# Patient Record
Sex: Female | Born: 1990 | Race: Black or African American | Hispanic: No | Marital: Married | Smoking: Never smoker
Health system: Southern US, Community
[De-identification: ages and names within clinical notes are randomized; demographics above are authoritative.]

## PROBLEM LIST (undated history)

## (undated) DIAGNOSIS — Z8751 Personal history of pre-term labor: Secondary | ICD-10-CM

## (undated) DIAGNOSIS — J40 Bronchitis, not specified as acute or chronic: Secondary | ICD-10-CM

## (undated) DIAGNOSIS — K219 Gastro-esophageal reflux disease without esophagitis: Secondary | ICD-10-CM

## (undated) DIAGNOSIS — O24419 Gestational diabetes mellitus in pregnancy, unspecified control: Secondary | ICD-10-CM

## (undated) DIAGNOSIS — O3432 Maternal care for cervical incompetence, second trimester: Secondary | ICD-10-CM

## (undated) HISTORY — DX: Personal history of pre-term labor: Z87.51

## (undated) HISTORY — DX: Maternal care for cervical incompetence, second trimester: O34.32

## (undated) HISTORY — DX: Gestational diabetes mellitus in pregnancy, unspecified control: O24.419

---

## 2010-02-23 ENCOUNTER — Emergency Department: Payer: Self-pay | Admitting: Emergency Medicine

## 2010-02-25 ENCOUNTER — Emergency Department: Payer: Self-pay | Admitting: Emergency Medicine

## 2010-03-01 ENCOUNTER — Emergency Department: Payer: Self-pay | Admitting: Unknown Physician Specialty

## 2010-03-26 ENCOUNTER — Emergency Department: Payer: Self-pay | Admitting: Internal Medicine

## 2010-04-01 ENCOUNTER — Emergency Department: Payer: Self-pay | Admitting: Emergency Medicine

## 2010-04-11 ENCOUNTER — Emergency Department: Payer: Self-pay | Admitting: Unknown Physician Specialty

## 2010-05-21 ENCOUNTER — Inpatient Hospital Stay: Payer: Self-pay | Admitting: Obstetrics and Gynecology

## 2010-05-27 LAB — PATHOLOGY REPORT

## 2010-10-12 ENCOUNTER — Encounter: Payer: Self-pay | Admitting: Maternal & Fetal Medicine

## 2010-11-02 ENCOUNTER — Ambulatory Visit: Payer: Self-pay | Admitting: Obstetrics and Gynecology

## 2010-11-12 ENCOUNTER — Ambulatory Visit: Payer: Self-pay | Admitting: Obstetrics and Gynecology

## 2010-11-12 HISTORY — PX: CERVICAL CERCLAGE: SHX1329

## 2010-12-09 ENCOUNTER — Emergency Department: Payer: Self-pay | Admitting: *Deleted

## 2011-04-07 ENCOUNTER — Observation Stay: Payer: Self-pay | Admitting: Obstetrics and Gynecology

## 2011-04-07 LAB — URINALYSIS, COMPLETE
Bacteria: NONE SEEN
Bilirubin,UR: NEGATIVE
Blood: NEGATIVE
Nitrite: NEGATIVE
Ph: 7 (ref 4.5–8.0)
Specific Gravity: 1.004 (ref 1.003–1.030)
Squamous Epithelial: 2

## 2011-04-09 LAB — URINE CULTURE

## 2011-04-13 ENCOUNTER — Inpatient Hospital Stay: Payer: Self-pay | Admitting: Obstetrics and Gynecology

## 2011-04-14 LAB — CBC WITH DIFFERENTIAL/PLATELET
Basophil #: 0 10*3/uL (ref 0.0–0.1)
HCT: 28.4 % — ABNORMAL LOW (ref 35.0–47.0)
Lymphocyte #: 1.2 10*3/uL (ref 1.0–3.6)
Lymphocyte %: 12 %
MCH: 29.1 pg (ref 26.0–34.0)
MCHC: 33 g/dL (ref 32.0–36.0)
Monocyte #: 1 10*3/uL — ABNORMAL HIGH (ref 0.0–0.7)
Monocyte %: 9.9 %
Neutrophil #: 7.8 10*3/uL — ABNORMAL HIGH (ref 1.4–6.5)
Platelet: 220 10*3/uL (ref 150–440)
RDW: 13.5 % (ref 11.5–14.5)
WBC: 10.1 10*3/uL (ref 3.6–11.0)

## 2011-04-15 LAB — HEMATOCRIT: HCT: 29.6 % — ABNORMAL LOW (ref 35.0–47.0)

## 2014-07-30 NOTE — H&P (Signed)
L&D Evaluation:  History:   HPI 24 yo G2P0010 at 4841w6d [facility] who had a cerclage in place this pregnancy seconday to prior 18 week loss.  Cerclarge was removed yesterday and patient was found to be 3cm dilated.  Remains 3 cm dilated today but complaints of ROM.  States occurrend earlier this evening, constant leaking rather than big gush.  +FM, no VB.  A pos / ABSC neg / RI / VZI / RPR NR / HBsAG neg / HIV neg / GC & CT neg & neg / 1-hr [place] 135 / GBS UNKNOWN    Presents with contractions, leaking fluid    Patient's Medical History No Chronic Illness    Patient's Surgical History Cerclage    Medications Pre Natal Vitamins    Allergies NKDA    Social History none    Family History Non-Contributory   ROS:   ROS All systems were reviewed.  HEENT, CNS, GI, GU, Respiratory, CV, Renal and Musculoskeletal systems were found to be normal.   Exam:   Vital Signs stable    General no apparent distress    Mental Status clear    Chest clear    Heart normal sinus rhythm    Abdomen gravid, non-tender    Estimated Fetal Weight Average for gestational age    Fetal Position Vtx    Back no CVAT    Edema no edema    Reflexes 1+    Pelvic no external lesions, 3/50/-3    Description clear    FHT normal rate with no decels    Ucx regular, 5min    Other Positive pooling / positive nitrazine / positive ferning   Impression:   Impression early labor   Plan:   Plan EFM/NST, monitor contractions and for cervical change, antibiotics for GBBS prophylaxis    Comments - Admit for rupture of membranes - GBS culture - Antibiotics for GBS unknown   Electronic Signatures: Lorrene ReidStaebler, Hoy Fallert M (MD)  (Signed 23-Jan-13 00:50)  Authored: L&D Evaluation   Last Updated: 23-Jan-13 00:50 by Lorrene ReidStaebler, Kawhi Diebold M (MD)

## 2014-07-30 NOTE — H&P (Signed)
L&D Evaluation:  History:   HPI 24 yo G2P0010 at 4313w6d by LMP c/w 1st trim u/s.  Pregnancy complicated by history of 18 week delivery. She has been seen by [facility] and she has a cerclage in place that is scheduled to be removed tomorrow in clinic.  She presents with concern for LOF at 11am this morning. She had no gush of fluid.  But, has had wet clothes.  Notes +FM, no vb, no contractions.    Patient's Medical History No Chronic Illness    Patient's Surgical History cerclage    Medications Pre Natal Vitamins    Allergies NKDA    Social History none    Family History Non-Contributory   ROS:   ROS All systems were reviewed.  HEENT, CNS, GI, GU, Respiratory, CV, Renal and Musculoskeletal systems were found to be normal.   Exam:   Vital Signs stable    General no apparent distress    Mental Status clear    Chest clear    Heart normal sinus rhythm    Abdomen gravid, non-tender    Estimated Fetal Weight Average for gestational age    Back no CVAT    Edema no edema    FHT normal rate with no decels    FHT Description 130/mod var/+accels/no decels    Ucx absent, possibly slight irritability    Other Nitrazine + (urine dipped as well and was positive for nitrazine) SSE: negative for pooling (also negative with valsalva) Ferning: negative   Impression:   Impression reactive NST, rule out PPROM, cerclage in place   Plan:   Plan UA, EFM/NST, monitor contractions and for cervical change    Comments Will send UA with reflex culture    Follow Up Appointment already scheduled   Electronic Signatures: Conard NovakJackson, Shakeia Krus D (MD)  (Signed 16-Jan-13 15:43)  Authored: L&D Evaluation   Last Updated: 16-Jan-13 15:43 by Conard NovakJackson, Billyjack Trompeter D (MD)

## 2017-06-24 LAB — OB RESULTS CONSOLE GC/CHLAMYDIA
Chlamydia: NEGATIVE
Gonorrhea: NEGATIVE

## 2017-07-04 ENCOUNTER — Encounter: Payer: Self-pay | Admitting: Obstetrics and Gynecology

## 2017-07-04 ENCOUNTER — Ambulatory Visit (INDEPENDENT_AMBULATORY_CARE_PROVIDER_SITE_OTHER): Payer: 59 | Admitting: Obstetrics and Gynecology

## 2017-07-04 VITALS — BP 114/66 | HR 73 | Ht 64.0 in | Wt 245.0 lb

## 2017-07-04 DIAGNOSIS — Z113 Encounter for screening for infections with a predominantly sexual mode of transmission: Secondary | ICD-10-CM

## 2017-07-04 DIAGNOSIS — Z8759 Personal history of other complications of pregnancy, childbirth and the puerperium: Secondary | ICD-10-CM

## 2017-07-04 DIAGNOSIS — Z23 Encounter for immunization: Secondary | ICD-10-CM

## 2017-07-04 DIAGNOSIS — Z8742 Personal history of other diseases of the female genital tract: Secondary | ICD-10-CM

## 2017-07-04 DIAGNOSIS — Z3046 Encounter for surveillance of implantable subdermal contraceptive: Secondary | ICD-10-CM | POA: Diagnosis not present

## 2017-07-04 DIAGNOSIS — Z0000 Encounter for general adult medical examination without abnormal findings: Secondary | ICD-10-CM

## 2017-07-04 DIAGNOSIS — Z6841 Body Mass Index (BMI) 40.0 and over, adult: Secondary | ICD-10-CM

## 2017-07-04 NOTE — Progress Notes (Signed)
Gynecology Annual Exam   PCP: Patient, No Pcp Per  Chief Complaint:  Chief Complaint  Patient presents with  . Gynecologic Exam    nexplanon removal    History of Present Illness: Patient is a 27 y.o. Z6X0960 presents for annual exam. The patient has no complaints today.   LMP: No LMP recorded. Patient has had an implant. Average Interval: no periods Duration of flow: 0 days Heavy Menses: no Clots: no Intermenstrual Bleeding: no Postcoital Bleeding: no Dysmenorrhea: no  The patient is sexually active. She currently uses Nexplanon for contraception. She denies dyspareunia.  The patient does not perform self breast exams.  There is no notable family history of breast or ovarian cancer in her family.  The patient wears seatbelts: yes.   The patient has regular exercise: no.    The patient denies current symptoms of depression.    Review of Systems: ROS  Past Medical History:  Past Medical History:  Diagnosis Date  . History of preterm delivery     Past Surgical History:  Past Surgical History:  Procedure Laterality Date  . CERVICAL CERCLAGE  11/12/2010    Gynecologic History:  No LMP recorded. Patient has had an implant. Contraception: Nexplanon Last Pap: Results were: NIL and HR HPV negative in 2017   Obstetric History: G2P0111  Family History:  Family History  Problem Relation Age of Onset  . Cancer Neg Hx   . Diabetes Neg Hx   . Hypertension Neg Hx   . Stroke Neg Hx   . Thyroid disease Neg Hx     Social History:  Social History   Socioeconomic History  . Marital status: Single    Spouse name: Not on file  . Number of children: Not on file  . Years of education: Not on file  . Highest education level: Not on file  Occupational History  . Not on file  Social Needs  . Financial resource strain: Not on file  . Food insecurity:    Worry: Not on file    Inability: Not on file  . Transportation needs:    Medical: Not on file    Non-medical:  Not on file  Tobacco Use  . Smoking status: Never Smoker  . Smokeless tobacco: Never Used  Substance and Sexual Activity  . Alcohol use: Never    Frequency: Never  . Drug use: Never  . Sexual activity: Yes    Birth control/protection: Implant  Lifestyle  . Physical activity:    Days per week: 0 days    Minutes per session: 0 min  . Stress: Not at all  Relationships  . Social connections:    Talks on phone: Not on file    Gets together: Not on file    Attends religious service: Not on file    Active member of club or organization: Not on file    Attends meetings of clubs or organizations: Not on file    Relationship status: Not on file  . Intimate partner violence:    Fear of current or ex partner: Not on file    Emotionally abused: Not on file    Physically abused: Not on file    Forced sexual activity: Not on file  Other Topics Concern  . Not on file  Social History Narrative  . Not on file    Allergies:  No Known Allergies  Medications: Prior to Admission medications   Not on File    Physical Exam Vitals: Blood  pressure 114/66, pulse 73, height 5\' 4"  (1.626 m), weight 245 lb (111.1 kg).  General: NAD HEENT: normocephalic, anicteric Thyroid: no enlargement, no palpable nodules Pulmonary: No increased work of breathing, CTAB Cardiovascular: RRR, distal pulses 2+ Breast: Breast symmetrical, no tenderness, no palpable nodules or masses, no skin or nipple retraction present, no nipple discharge.  No axillary or supraclavicular lymphadenopathy. Abdomen: NABS, soft, non-tender, non-distended.  Umbilicus without lesions.  No hepatomegaly, splenomegaly or masses palpable. No evidence of hernia  Genitourinary:  External: Normal external female genitalia.  Normal urethral meatus, normal Bartholin's and Skene's glands.    Vagina: Normal vaginal mucosa, no evidence of prolapse.    Uterus: Non-enlarged, mobile, normal contour.  No CMT  Adnexa: ovaries non-enlarged, no  adnexal masses  Rectal: deferred  Lymphatic: no evidence of inguinal lymphadenopathy Extremities: no edema, erythema, or tenderness Neurologic: Grossly intact Psychiatric: mood appropriate, affect full  Female chaperone present for pelvic and breast  portions of the physical exam  Nexplanon removal Procedure note - The Nexplanon was noted in the patient's arm and the end was identified. The skin was cleansed with a Betadine solution. A small injection of subcutaneous lidocaine with epinephrine was given over the end of the implant. An incision was made at the end of the implant. The rod was noted in the incision and grasped with a hemostat. It was noted to be intact.  Steri-Strip was placed approximating the incision. Hemostasis was noted.  Natale Milchhristanna R Vera Wishart ,MD 07/04/2017,2:35 PM    Assessment: 27 y.o. G2P0111 routine annual exam  Plan: Problem List Items Addressed This Visit    None    Visit Diagnoses    Nexplanon removal    -  Primary   Health care maintenance       Relevant Orders   GC/Chlamydia Probe Amp   Screening for STD (sexually transmitted disease)       Relevant Orders   GC/Chlamydia Probe Amp   Morbid obesity (HCC)       BMI 40.0-44.9, adult [facility](HCC)       Relevant Orders   Referral to Nutrition and Diabetes Services      2) STI screening  was offered and accepted  2)  ASCCP guidelines and rational discussed.  Patient opts for every 3 years screening interval. She declines pap today and will return in 1 year.   3) Contraception - the patient is currently using  Nexplanon.  She is attempting to conceive in the near future. Nexplanon removed today. Preconception counseling performed. Encouraged patient to start taking a prenatal vitamin now and to obtain pregnacy care early because she has a history of cerclage  4) Routine healthcare maintenance including cholesterol, diabetes screening discussed managed by PCP  5) Gardasil vaccination today.  6) Referral to  nutrition for help managing morbid obesity.  Encouraged healthy diet and exercise.   7) Return in about 2 months (around 09/03/2017) for vaccination, nurse visit.   Adelene Idlerhristanna Yassmin Binegar MD [facility], [facility] 07/04/2017, 2:33 PM

## 2017-07-04 NOTE — Addendum Note (Signed)
Addended by: Reather LittlerUTHUS, Odesser Tourangeau D on: 07/04/2017 02:47 PM   Modules accepted: Orders

## 2017-07-06 LAB — GC/CHLAMYDIA PROBE AMP
CHLAMYDIA, DNA PROBE: NEGATIVE
NEISSERIA GONORRHOEAE BY PCR: NEGATIVE

## 2017-07-06 NOTE — Progress Notes (Signed)
Negative, released to mychart

## 2017-07-06 NOTE — Progress Notes (Signed)
Please call and let patient know she does not have chlamydia or gonorrhea. Thank you, Dr. Jerene PitchSchuman

## 2017-07-19 ENCOUNTER — Encounter: Payer: 59 | Attending: Obstetrics and Gynecology | Admitting: Dietician

## 2017-07-19 ENCOUNTER — Encounter: Payer: Self-pay | Admitting: Dietician

## 2017-07-19 VITALS — Ht 64.0 in | Wt 242.8 lb

## 2017-07-19 DIAGNOSIS — Z713 Dietary counseling and surveillance: Secondary | ICD-10-CM | POA: Insufficient documentation

## 2017-07-19 DIAGNOSIS — Z6841 Body Mass Index (BMI) 40.0 and over, adult: Secondary | ICD-10-CM | POA: Insufficient documentation

## 2017-07-19 NOTE — Progress Notes (Signed)
Medical Nutrition Therapy: Visit start time:10:45 end time: 11:45  Assessment:  Diagnosis: obesity  Psychosocial issues/ stress concerns: none identified Preferred learning method:  . Hands-on  Current weight: 242.8 lbs   Height: 64 in  Medications, supplements: none  Progress and evaluation:  Patient in for initial medical nutrition therapy appointment. She reports she would like to have another child and her doctor suggested that she lose weight. She gives a weight goal of 160 lbs which she states she weighed 2 years ago. She reports a year of accelerated weight gain which she relates to the birth control used. She reports she has already made some positive diet changes by "eating more grilled and less fried foods, eating more salads and eating less starches". 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 Her present diet is low in fruits/vegetables, whole grains and calcium sources.  Physical activity: no structured exercise; "I'm on my feet the whole time at work".  Dietary Intake:  Usual eating pattern includes 2-3 meals and 1 snacks per day. Dining out frequency: 2 meals per week.  Breakfast: 7-8:00- egg whites/turkey bacon, grits, orange juice Snack: rarely snacks Lunch: 6:00pm- 4 piece nuggets and burger or burger/fries, water Supper: 9:00pm-turkey or other cold cut sandwich, water 11:00- typical bedtime Beverages: 6-7 cups of water daily, 10-12 oz sweet tea, 1 cup orange juice  Nutrition Care Education: Basic nutrition/Weight control:  Commended on positive diet changes she has already made. Instructed on a meal plan based on 1800 calories including carbohydrate counting and how to better balance protein, carbohydrates and non-starchy vegetables. Encouraged to use meal plan as a guide and discussed ways to use mindfulness concept when making decisions regarding food choices and portions. Used food models to show portions. Gave and reviewed list of snack suggestions  for work.   Nutritional Diagnosis:  NI-5.11.1 Predicted suboptimal nutrient intake As related to low intake of fruits, vegetables, whole grains and calcium sources..  As evidenced by diet history..  Intervention:  Balance meals with 2-4 oz lean protein, 2-4 servings carbohydrate and non-starchy vegetables. Limit added fats such as margarine, butter, mayonnaise or salad dressings. Count chicken nuggets as protein and carbohydrate. Add a fruit or vegetable or both. Include a snack/small meal between breakfast and 6:00 meal. Refer to suggestions. Include at least 48 oz water daily. Education Materials given:  . Food lists/ Planning A Balanced Meal . Sample meal pattern/ menus . Snacking handout . Goals/ instructions  Learner/ who was taught:  . Patient   Level of understanding: . Partial understanding; needs review/ practice Demonstrated degree of understanding via:   Teach back Learning barriers: . None Willingness to learn/ readiness for change: Change in progress Monitoring and Evaluation:  Dietary intake, exercise, and body weight      follow up: May 23rd at 9:00am

## 2017-07-19 NOTE — Patient Instructions (Addendum)
Balance meals with 2-4 oz lean protein, 2-4 servings carbohydrate and non-starchy vegetables. Limit added fats such as margarine, butter, mayonnaise or salad dressings. Count chicken nuggets as protein and carbohydrate. Add a fruit or vegetable or both. Include a snack/small meal between breakfast and 6:00 meal. Refer to suggestions. Include at least 48 oz water daily.

## 2017-08-11 ENCOUNTER — Telehealth: Payer: Self-pay | Admitting: Dietician

## 2017-08-11 ENCOUNTER — Ambulatory Visit: Payer: 59 | Admitting: Dietician

## 2017-08-11 NOTE — Telephone Encounter (Signed)
Called and left message requesting that patient call to inform as to whether she wants to reschedule her missed appointment today (5/23).

## 2017-08-25 ENCOUNTER — Encounter: Payer: Self-pay | Admitting: Dietician

## 2017-09-05 ENCOUNTER — Ambulatory Visit: Payer: 59

## 2017-09-06 ENCOUNTER — Ambulatory Visit (INDEPENDENT_AMBULATORY_CARE_PROVIDER_SITE_OTHER): Payer: 59 | Admitting: Obstetrics and Gynecology

## 2017-09-06 ENCOUNTER — Encounter: Payer: Self-pay | Admitting: Obstetrics and Gynecology

## 2017-09-06 VITALS — BP 100/60 | Ht 64.0 in | Wt 245.0 lb

## 2017-09-06 DIAGNOSIS — O0993 Supervision of high risk pregnancy, unspecified, third trimester: Secondary | ICD-10-CM | POA: Insufficient documentation

## 2017-09-06 DIAGNOSIS — Z3A01 Less than 8 weeks gestation of pregnancy: Secondary | ICD-10-CM

## 2017-09-06 DIAGNOSIS — Z8759 Personal history of other complications of pregnancy, childbirth and the puerperium: Secondary | ICD-10-CM

## 2017-09-06 DIAGNOSIS — Z130 Encounter for screening for diseases of the blood and blood-forming organs and certain disorders involving the immune mechanism: Secondary | ICD-10-CM

## 2017-09-06 DIAGNOSIS — Z6841 Body Mass Index (BMI) 40.0 and over, adult: Secondary | ICD-10-CM

## 2017-09-06 DIAGNOSIS — O219 Vomiting of pregnancy, unspecified: Secondary | ICD-10-CM

## 2017-09-06 DIAGNOSIS — Z8742 Personal history of other diseases of the female genital tract: Secondary | ICD-10-CM

## 2017-09-06 LAB — OB RESULTS CONSOLE RPR: RPR: NONREACTIVE

## 2017-09-06 LAB — OB RESULTS CONSOLE VARICELLA ZOSTER ANTIBODY, IGG: Varicella: IMMUNE

## 2017-09-06 MED ORDER — PYRIDOXINE HCL 25 MG PO TABS: 25.0000 mg | ORAL_TABLET | Freq: Four times a day (QID) | ORAL | 6 refills | Status: DC

## 2017-09-06 MED ORDER — DOXYLAMINE SUCCINATE (SLEEP) 25 MG PO TABS: 25.0000 mg | ORAL_TABLET | Freq: Four times a day (QID) | ORAL | 6 refills | Status: DC | PRN

## 2017-09-06 NOTE — Progress Notes (Signed)
Pregnancy confirmed at [facility]. Pt reports no problems. Pt only had spotting after nexplanon removal. Unsure exact dates.

## 2017-09-06 NOTE — Progress Notes (Signed)
09/06/2017   Chief Complaint: Missed period  Transfer of Care Patient: no  History of Present Illness: [name] is a [age] y.o. Z6X0960G3P0111 6737w1d based on Patient's last menstrual period was 07/25/2017 (exact date). with an Estimated Date of Delivery: 05/01/18, with the above CC.   Her periods were: Spotting at the beginning of May for 3 days. Nexplanon removed April 15th. Positive home pregnancy test at the end of May.  She was using no method when she conceived.  She has Positive signs or symptoms of nausea/vomiting of pregnancy. She has Negative signs or symptoms of miscarriage or preterm labor She identifies Negative Zika risk factors for her and her partner On any different medications around the time she conceived/early pregnancy: No  History of varicella: Yes   ROS: A 12-point review of systems was performed and negative, except as stated in the above HPI.  OBGYN History: As per HPI. OB History  Gravida Para Term Preterm AB Living  3 1   1 1 1   SAB TAB Ectopic Multiple Live Births  1       1    # Outcome Date GA Lbr Len/2nd Weight Sex Delivery Anes PTL Lv  3 Current           2 Preterm 04/14/11 172w0d  5 lb 10 oz (2.551 kg) F Vag-Spont   LIV     Birth Comments: cerclage  1 SAB 05/22/10 1643w0d   F    FD     Birth Comments: preterm labor    Any issues with any prior pregnancies: yes Any prior children are healthy, doing well, without any problems or issues: yes History of pap smears: Yes. Last pap smear 2017. NIL pap History of STIs: No   Past Medical History: Past Medical History:  Diagnosis Date  . History of preterm delivery     Past Surgical History: Past Surgical History:  Procedure Laterality Date  . CERVICAL CERCLAGE  11/12/2010    Family History:  Family History  Problem Relation Age of Onset  . Cancer Neg Hx   . Diabetes Neg Hx   . Hypertension Neg Hx   . Stroke Neg Hx   . Thyroid disease Neg Hx    She denies any female cancers, bleeding or blood  clotting disorders.  She denies any history of mental retardation, birth defects or genetic disorders in her or the FOB's history  Social History:  Social History   Socioeconomic History  . Marital status: Married    Spouse name: Not on file  . Number of children: Not on file  . Years of education: Not on file  . Highest education level: Not on file  Occupational History  . Not on file  Social Needs  . Financial resource strain: Not on file  . Food insecurity:    Worry: Not on file    Inability: Not on file  . Transportation needs:    Medical: Not on file    Non-medical: Not on file  Tobacco Use  . Smoking status: Never Smoker  . Smokeless tobacco: Never Used  Substance and Sexual Activity  . Alcohol use: Never    Frequency: Never  . Drug use: Never  . Sexual activity: Yes    Birth control/protection: None  Lifestyle  . Physical activity:    Days per week: 0 days    Minutes per session: 0 min  . Stress: Not at all  Relationships  . Social connections:    Talks on  phone: Not on file    Gets together: Not on file    Attends religious service: Not on file    Active member of club or organization: Not on file    Attends meetings of clubs or organizations: Not on file    Relationship status: Not on file  . Intimate partner violence:    Fear of current or ex partner: Not on file    Emotionally abused: Not on file    Physically abused: Not on file    Forced sexual activity: Not on file  Other Topics Concern  . Not on file  Social History Narrative  . Not on file   Any pets in the household: no    Allergy: No Known Allergies  Current Outpatient Medications: No current outpatient medications on file.   Physical Exam:   BP 100/60   Wt 245 lb (111.1 kg)   LMP 07/25/2017 (Exact Date)   BMI 42.05 kg/m  Body mass index is 42.05 kg/m. Constitutional: Well nourished, well developed female in no acute distress.  Neck:  Supple, normal appearance, and no  thyromegaly  Cardiovascular: S1, S2 normal, no murmur, rub or gallop, regular rate and rhythm Respiratory:  Clear to auscultation bilateral. Normal respiratory effort Abdomen: positive bowel sounds and no masses, hernias; diffusely non tender to palpation, non distended Breasts: breasts appear normal, no suspicious masses, no skin or nipple changes or axillary nodes. Neuro/Psych:  Normal mood and affect.  Skin:  Warm and dry.  Lymphatic:  No inguinal lymphadenopathy.   Pelvic exam: is not limited by body habitus EGBUS: within normal limits, Vagina: within normal limits and with no blood in the vault, Cervix: normal appearing cervix without discharge or lesions, closed/long/high, Uterus:  nonenlarged, and Adnexa:  normal adnexa and no mass, fullness, tenderness  Assessment: Lindsay Wagner is a 27 y.o. Z6X0960 [redacted]w[redacted]d based on Patient's last menstrual period was 07/25/2017 (exact date). with an Estimated Date of Delivery: 05/01/18,  for prenatal care.  Plan:  1) Avoid alcoholic beverages. 2) Patient encouraged not to smoke.  3) Discontinue the use of all non-medicinal drugs and chemicals.  4) Take prenatal vitamins daily.  5) Seatbelt use advised 6) Nutrition, food safety (fish, cheese advisories, and high nitrite foods) and exercise discussed. 7) Hospital and practice style delivering at [facility] discussed  8) Patient is asked about travel to areas at risk for the Zika virus, and counseled to avoid travel and exposure to mosquitoes or sexual partners who may have themselves been exposed to the virus. Testing is discussed, and will be ordered as appropriate.  9) Childbirth classes at [facility] advised 10) Genetic Screening, such as with 1st Trimester Screening, cell free fetal DNA, AFP testing, and Ultrasound, as well as with amniocentesis and CVS as appropriate, is discussed with patient. She plans to have genetic testing this pregnancy. 11) Patient will need a cerclage this pregnancy, plan for 12 weeks.  12)  Return to care in 1 week for dating [place] and ROB.  13) Sickle cell screening today.    Problem list reviewed and updated.  Adelene Idler MD [facility], [facility] 09/06/17 10:33 AM

## 2017-09-07 LAB — RPR+RH+ABO+RUB AB+AB SCR+CB...
Antibody Screen: NEGATIVE
HEMOGLOBIN: 12.2 g/dL (ref 11.1–15.9)
HEP B S AG: NEGATIVE
HIV Screen 4th Generation wRfx: NONREACTIVE
Hematocrit: 36.4 % (ref 34.0–46.6)
MCH: 29.2 pg (ref 26.6–33.0)
MCHC: 33.5 g/dL (ref 31.5–35.7)
MCV: 87 fL (ref 79–97)
Platelets: 306 10*3/uL (ref 150–450)
RBC: 4.18 x10E6/uL (ref 3.77–5.28)
RDW: 13.6 % (ref 12.3–15.4)
RPR Ser Ql: NONREACTIVE
RUBELLA: 1.81 {index} (ref >0.99)
Rh Factor: POSITIVE
VARICELLA: 333 {index} (ref >165)
WBC: 4.6 10*3/uL (ref 3.4–10.8)

## 2017-09-08 LAB — HEMOGLOBINOPATHY EVALUATION
HGB A: 97.3 % (ref 96.4–98.8)
HGB C: 0 %
HGB S: 0 %
HGB VARIANT: 0 %
Hemoglobin A2 Quantitation: 2.7 % (ref 1.8–3.2)
Hemoglobin F Quantitation: 0 % (ref 0.0–2.0)

## 2017-09-08 LAB — URINE CULTURE

## 2017-09-08 LAB — SPECIMEN STATUS REPORT

## 2017-09-13 ENCOUNTER — Ambulatory Visit (INDEPENDENT_AMBULATORY_CARE_PROVIDER_SITE_OTHER): Payer: 59 | Admitting: Certified Nurse Midwife

## 2017-09-13 ENCOUNTER — Other Ambulatory Visit: Payer: 59

## 2017-09-13 ENCOUNTER — Ambulatory Visit (INDEPENDENT_AMBULATORY_CARE_PROVIDER_SITE_OTHER): Payer: 59

## 2017-09-13 VITALS — BP 102/66 | Wt 240.0 lb

## 2017-09-13 DIAGNOSIS — O0993 Supervision of high risk pregnancy, unspecified, third trimester: Secondary | ICD-10-CM

## 2017-09-13 DIAGNOSIS — Z8759 Personal history of other complications of pregnancy, childbirth and the puerperium: Secondary | ICD-10-CM

## 2017-09-13 DIAGNOSIS — Z1379 Encounter for other screening for genetic and chromosomal anomalies: Secondary | ICD-10-CM

## 2017-09-13 DIAGNOSIS — Z3A01 Less than 8 weeks gestation of pregnancy: Secondary | ICD-10-CM | POA: Diagnosis not present

## 2017-09-13 DIAGNOSIS — O09291 Supervision of pregnancy with other poor reproductive or obstetric history, first trimester: Secondary | ICD-10-CM | POA: Diagnosis not present

## 2017-09-13 DIAGNOSIS — Z6841 Body Mass Index (BMI) 40.0 and over, adult: Secondary | ICD-10-CM

## 2017-09-13 DIAGNOSIS — Z8742 Personal history of other diseases of the female genital tract: Secondary | ICD-10-CM

## 2017-09-13 DIAGNOSIS — Z3481 Encounter for supervision of other normal pregnancy, first trimester: Secondary | ICD-10-CM

## 2017-09-13 NOTE — Progress Notes (Signed)
ROB and dating scan: CRL 7wk 6days with FCA 167. Will change EDC to 04/26/2018 to reflect this ultrasound, since she was amenorrhiec on Nexplanon and had only one day of spotting after it was removed. Unsure whether she desires genetic testing. Having 1 hour GTT today. NOB lab results reviewed. Will schedule to return in 4 weeks for a ultrasound for cervical length and for NT and to see Dr Jerene PitchSchuman regarding cerclage First trimester test at next visit if patient desires Farrel Connersolleen Wilmer Santillo, CNM

## 2017-09-13 NOTE — Progress Notes (Signed)
Pt c/o morning sickness. Dating scan today. No other problems.

## 2017-09-14 LAB — GLUCOSE TOLERANCE, 1 HOUR: GLUCOSE, 1HR PP: 148 mg/dL (ref 65–199)

## 2017-09-15 ENCOUNTER — Other Ambulatory Visit: Payer: Self-pay | Admitting: Obstetrics and Gynecology

## 2017-09-15 DIAGNOSIS — R7309 Other abnormal glucose: Secondary | ICD-10-CM

## 2017-09-15 NOTE — Progress Notes (Signed)
Discussed with patient on phone, normal

## 2017-09-15 NOTE — Progress Notes (Signed)
Needs 3 hour  Gtt, discussed with patient on the phone.

## 2017-09-20 ENCOUNTER — Encounter: Payer: Self-pay | Admitting: Obstetrics and Gynecology

## 2017-09-20 NOTE — Telephone Encounter (Signed)
She can take under the tongue zofran or do phenergan suppositories.Let me know if I need to order them.  Thank you, Dr. Jerene PitchSchuman

## 2017-09-21 ENCOUNTER — Other Ambulatory Visit: Payer: Self-pay | Admitting: Obstetrics and Gynecology

## 2017-09-21 ENCOUNTER — Encounter: Payer: Self-pay | Admitting: Obstetrics and Gynecology

## 2017-09-21 DIAGNOSIS — O219 Vomiting of pregnancy, unspecified: Secondary | ICD-10-CM

## 2017-09-21 MED ORDER — PROMETHAZINE HCL 25 MG RE SUPP: 25.0000 mg | Freq: Four times a day (QID) | RECTAL | 5 refills | Status: DC | PRN

## 2017-09-21 NOTE — Telephone Encounter (Signed)
Prescription sent, thank you

## 2017-10-11 ENCOUNTER — Ambulatory Visit (INDEPENDENT_AMBULATORY_CARE_PROVIDER_SITE_OTHER): Payer: 59 | Admitting: Obstetrics and Gynecology

## 2017-10-11 ENCOUNTER — Encounter: Payer: Self-pay | Admitting: Obstetrics and Gynecology

## 2017-10-11 ENCOUNTER — Ambulatory Visit (INDEPENDENT_AMBULATORY_CARE_PROVIDER_SITE_OTHER): Payer: 59

## 2017-10-11 ENCOUNTER — Other Ambulatory Visit: Payer: 59

## 2017-10-11 VITALS — BP 118/70 | Wt 234.5 lb

## 2017-10-11 DIAGNOSIS — Z8742 Personal history of other diseases of the female genital tract: Secondary | ICD-10-CM

## 2017-10-11 DIAGNOSIS — O09211 Supervision of pregnancy with history of pre-term labor, first trimester: Secondary | ICD-10-CM | POA: Diagnosis not present

## 2017-10-11 DIAGNOSIS — O210 Mild hyperemesis gravidarum: Secondary | ICD-10-CM | POA: Insufficient documentation

## 2017-10-11 DIAGNOSIS — O09291 Supervision of pregnancy with other poor reproductive or obstetric history, first trimester: Secondary | ICD-10-CM

## 2017-10-11 DIAGNOSIS — O09293 Supervision of pregnancy with other poor reproductive or obstetric history, third trimester: Secondary | ICD-10-CM | POA: Diagnosis not present

## 2017-10-11 DIAGNOSIS — O0993 Supervision of high risk pregnancy, unspecified, third trimester: Secondary | ICD-10-CM

## 2017-10-11 DIAGNOSIS — Z6841 Body Mass Index (BMI) 40.0 and over, adult: Secondary | ICD-10-CM

## 2017-10-11 DIAGNOSIS — R7309 Other abnormal glucose: Secondary | ICD-10-CM

## 2017-10-11 DIAGNOSIS — Z3A12 12 weeks gestation of pregnancy: Secondary | ICD-10-CM

## 2017-10-11 DIAGNOSIS — Z1379 Encounter for other screening for genetic and chromosomal anomalies: Secondary | ICD-10-CM

## 2017-10-11 DIAGNOSIS — Z8759 Personal history of other complications of pregnancy, childbirth and the puerperium: Secondary | ICD-10-CM

## 2017-10-11 DIAGNOSIS — O99211 Obesity complicating pregnancy, first trimester: Secondary | ICD-10-CM

## 2017-10-11 DIAGNOSIS — Z8751 Personal history of pre-term labor: Secondary | ICD-10-CM | POA: Insufficient documentation

## 2017-10-11 DIAGNOSIS — Z3A11 11 weeks gestation of pregnancy: Secondary | ICD-10-CM

## 2017-10-11 MED ORDER — ONDANSETRON 4 MG PO TBDP: 4.0000 mg | ORAL_TABLET | Freq: Three times a day (TID) | ORAL | 3 refills | Status: DC | PRN

## 2017-10-11 MED ORDER — PYRIDOXINE HCL 25 MG PO TABS: 25.0000 mg | ORAL_TABLET | Freq: Four times a day (QID) | ORAL | 3 refills | Status: DC | PRN

## 2017-10-11 NOTE — Progress Notes (Signed)
ROB U/S today No concerns

## 2017-10-11 NOTE — Progress Notes (Signed)
Routine Prenatal Care Visit  Subjective  Lindsay AlbertsMikesha A Fleig is a 27 y.o. [phone] at 5074w6d being seen today for ongoing prenatal care.  She is currently monitored for the following issues for this high-risk pregnancy and has BMI 40.0-44.9, adult (HCC); Morbid obesity (HCC); History of cervical incompetence; Supervision of high risk pregnancy, antepartum, third trimester; and History of preterm delivery on their problem list.  ----------------------------------------------------------------------------------- Patient reports no complaints.    . Vag. Bleeding: None.  Movement: Absent. Denies leaking of fluid.  ----------------------------------------------------------------------------------- The following portions of the patient's history were reviewed and updated as appropriate: allergies, current medications, past family history, past medical history, past social history, past surgical history and problem list. Problem list updated.   Objective  Blood pressure 118/70, weight 234 lb 8 oz (106.4 kg), last menstrual period 07/25/2017. Pregravid weight 245 lb (111.1 kg) Total Weight Gain -10 lb 8 oz (-4.763 kg) Urinalysis: Urine Protein: 1+ Urine Glucose: Trace  Body mass index is 40.25 kg/m.   Fetal Status: Fetal Heart Rate (bpm): 156   Movement: Absent     General:  Alert, oriented and cooperative. Patient is in no acute distress.  Skin: Skin is warm and dry. No rash noted.   Cardiovascular: Normal heart rate noted  Respiratory: Normal respiratory effort, no problems with respiration noted  Abdomen: Soft, gravid, appropriate for gestational age. Pain/Pressure: Absent     Pelvic:  Cervical exam deferred        Extremities: Normal range of motion.     ental Status: Normal mood and affect. Normal behavior. Normal judgment and thought content.     Assessment   27 y.o. [id number] at 1674w6d by  04/26/2018, by Ultrasound presenting for routine prenatal visit  Plan   Pregnancy #3 Problems  (from 09/06/17 to present)    Problem Noted Resolved   History of preterm delivery 10/11/2017 by Natale MilchSchuman, Christanna R, MD No   Supervision of high risk pregnancy, antepartum, third trimester 09/06/2017 by Natale MilchSchuman, Christanna R, MD No   Overview Addendum 09/13/2017  1:14 PM by Farrel ConnersGutierrez, Colleen, CNM      Clinic [facility] Prenatal Labs  Dating 7wk6d ultrasound Blood type: A/Positive/-- (06/18 1039)   Genetic Screen 1 Screen:     AFP:      Quad:      NIPS:    Antibody:Negative (06/18 1039)  Anatomic US  Rubella: 1.81 (06/18 1039) Varicella: Immune  GTT Early:        28 wk:      RPR: Non Reactive (06/18 1039)   Rhogam  HBsAg: Negative (06/18 1039)   TDaP vaccine                       HIV: Non Reactive (06/18 1039)   Flu Shot                                GBS:   Contraception  Pap:  CBB     CS/VBAC    Baby Food    Support Person                 Gestational age appropriate obstetric precautions including but not limited to vaginal bleeding, contractions, leaking of fluid and  fetal movement were reviewed in detail with the patient.    Hyperemesis continues, improved with rectal phenergan, but has lost 6 lbs since her previous visit and 10 lbs overall. She  is vomiting 2-3 times a day. Discussed adding B6 four times a day and Zofran as needed up to 3 times a day. Cerclage scheduled for this Thursday, consents signed in office.   3hr GTT today.  First trimester screen today  Return in about 2 weeks (around 10/25/2017) for ROB .  Adelene Idler MD [facility], [facility] 10/11/17 10:47 AM

## 2017-10-12 ENCOUNTER — Other Ambulatory Visit: Payer: Self-pay | Admitting: Obstetrics and Gynecology

## 2017-10-12 ENCOUNTER — Encounter
Admission: RE | Admit: 2017-10-12 | Discharge: 2017-10-12 | Disposition: A | Discharge status: Home / Self Care | Payer: 59 | Source: Ambulatory Visit | Attending: Obstetrics and Gynecology | Admitting: Obstetrics and Gynecology

## 2017-10-12 ENCOUNTER — Other Ambulatory Visit: Payer: Self-pay

## 2017-10-12 DIAGNOSIS — O24019 Pre-existing diabetes mellitus, type 1, in pregnancy, unspecified trimester: Secondary | ICD-10-CM

## 2017-10-12 DIAGNOSIS — O24311 Unspecified pre-existing diabetes mellitus in pregnancy, first trimester: Secondary | ICD-10-CM

## 2017-10-12 HISTORY — DX: Gastro-esophageal reflux disease without esophagitis: K21.9

## 2017-10-12 HISTORY — DX: Bronchitis, not specified as acute or chronic: J40

## 2017-10-12 LAB — GESTATIONAL GLUCOSE TOLERANCE
GLUCOSE 2 HOUR GTT: 201 mg/dL — AB (ref 65–154)
GLUCOSE 3 HOUR GTT: 118 mg/dL (ref 65–139)
Glucose, Fasting: 75 mg/dL (ref 65–94)
Glucose, GTT - 1 Hour: 202 mg/dL — ABNORMAL HIGH (ref 65–179)

## 2017-10-12 NOTE — Progress Notes (Signed)
POSITIVE- discussed with patient on the phone. Will have her follow up in office next week for a glucose monitor and testing supplies. Will place referral order to nutrition.

## 2017-10-12 NOTE — Patient Instructions (Signed)
Your procedure is scheduled on: 10-13-17  Report to Same Day Surgery 2nd floor medical mall [facility] Entrance-take elevator on left to 2nd floor.  Check in with surgery information desk.) To find out your arrival time please call [phone] between 1PM - 3PM on 10-12-17  Remember: Instructions that are not followed completely may result in serious medical risk, up to and including death, or upon the discretion of your surgeon and anesthesiologist your surgery may need to be rescheduled.    _x___ 1. Do not eat food after midnight the night before your procedure. NO GUM OR CANDY AFTER MIDNIGHT. You may drink clear liquids up to 2 hours before you are scheduled to arrive at the hospital for your procedure.  Do not drink clear liquids within 2 hours of your scheduled arrival to the hospital.  Clear liquids include  --Water or Apple juice without pulp  --Clear carbohydrate beverage such as ClearFast or Gatorade  --Black Coffee or Clear Tea (No milk, no creamers, do not add anything to the coffee or Tea .     __x__ 2. No Alcohol for 24 hours before or after surgery.   __x__3. No Smoking or e-cigarettes for 24 prior to surgery.  Do not use any chewable tobacco products for at least 6 hour prior to surgery   ____  4. Bring all medications with you on the day of surgery if instructed.    __x__ 5. Notify your doctor if there is any change in your medical condition     (cold, fever, infections).    x___6. On the morning of surgery brush your teeth with toothpaste and water.  You may rinse your mouth with mouth wash if you wish.  Do not swallow any toothpaste or mouthwash.   Do not wear jewelry, make-up, hairpins, clips or nail polish.  Do not wear lotions, powders, or perfumes. You may wear deodorant.  Do not shave 48 hours prior to surgery. Men may shave face and neck.  Do not bring valuables to the hospital.    [facility] is not responsible for any belongings or valuables.      Contacts, dentures or bridgework may not be worn into surgery.  Leave your suitcase in the car. After surgery it may be brought to your room.  For patients admitted to the hospital, discharge time is determined by your treatment team.  _  Patients discharged the day of surgery will not be allowed to drive home.  You will need someone to drive you home and stay with you the night of your procedure.    Please read over the following fact sheets that you were given:   [facility] Preparing for Surgery   _x___ Take anti-hypertensive listed below, cardiac, seizure, asthma,anti-reflux and psychiatric medicines. These include:  1. YOU MAY USE YOUR PHENERGAN SUPPOSITORY DAY OF SURGERY IF NEEDED  2.  3.  4.  5.  6.  ____Fleets enema or Magnesium Citrate as directed.   ____ Use CHG Soap or sage wipes as directed on instruction sheet   ____ Use inhalers on the day of surgery and bring to hospital day of surgery  ____ Stop Metformin and Janumet 2 days prior to surgery.    ____ Take 1/2 of usual insulin dose the night before surgery and none on the morning surgery.   ____ Follow recommendations from Cardiologist, Pulmonologist or PCP regarding stopping Aspirin, Coumadin, Plavix ,Eliquis, Effient, or Pradaxa, and Pletal.  X____Stop Anti-inflammatories such as Advil, Aleve, Ibuprofen,  Motrin, Naproxen, Naprosyn, Goodies powders or aspirin products NW-OK to take Tylenol    ____ Stop supplements until after surgery.    ____ Bring C-Pap to the hospital.

## 2017-10-13 ENCOUNTER — Ambulatory Visit: Payer: 59 | Admitting: Anesthesiology

## 2017-10-13 ENCOUNTER — Ambulatory Visit
Admission: RE | Admit: 2017-10-13 | Discharge: 2017-10-13 | Disposition: A | Discharge status: Home / Self Care | Payer: 59 | Source: Ambulatory Visit | Attending: Obstetrics and Gynecology | Admitting: Obstetrics and Gynecology

## 2017-10-13 ENCOUNTER — Encounter: Payer: Self-pay | Admitting: *Deleted

## 2017-10-13 ENCOUNTER — Encounter
Admission: RE | Disposition: A | Discharge status: Home / Self Care | Payer: Self-pay | Source: Ambulatory Visit | Attending: Obstetrics and Gynecology

## 2017-10-13 ENCOUNTER — Other Ambulatory Visit: Payer: Self-pay

## 2017-10-13 DIAGNOSIS — O09211 Supervision of pregnancy with history of pre-term labor, first trimester: Secondary | ICD-10-CM | POA: Diagnosis not present

## 2017-10-13 DIAGNOSIS — Z3A12 12 weeks gestation of pregnancy: Secondary | ICD-10-CM | POA: Insufficient documentation

## 2017-10-13 DIAGNOSIS — O3431 Maternal care for cervical incompetence, first trimester: Secondary | ICD-10-CM | POA: Insufficient documentation

## 2017-10-13 DIAGNOSIS — O210 Mild hyperemesis gravidarum: Secondary | ICD-10-CM | POA: Insufficient documentation

## 2017-10-13 DIAGNOSIS — O99211 Obesity complicating pregnancy, first trimester: Secondary | ICD-10-CM | POA: Insufficient documentation

## 2017-10-13 DIAGNOSIS — O09291 Supervision of pregnancy with other poor reproductive or obstetric history, first trimester: Secondary | ICD-10-CM | POA: Diagnosis not present

## 2017-10-13 HISTORY — PX: CERVICAL CERCLAGE: SHX1329

## 2017-10-13 LAB — CBC
HEMATOCRIT: 36.3 % (ref 35.0–47.0)
HEMOGLOBIN: 12.6 g/dL (ref 12.0–16.0)
MCH: 30.8 pg (ref 26.0–34.0)
MCHC: 34.6 g/dL (ref 32.0–36.0)
MCV: 88.9 fL (ref 80.0–100.0)
Platelets: 253 10*3/uL (ref 150–440)
RBC: 4.09 MIL/uL (ref 3.80–5.20)
RDW: 13.4 % (ref 11.5–14.5)
WBC: 4.6 10*3/uL (ref 3.6–11.0)

## 2017-10-13 LAB — FIRST TRIMESTER SCREEN W/NT
CRL: 52.5 mm
DIA MoM: 1.06
DIA Value: 214.7 pg/mL
Gest Age-Collect: 11.7 weeks
HCG MOM: 0.69
HCG VALUE: 55.8 [IU]/mL
Maternal Age At EDD: 28.9 yr
NUCHAL TRANSLUCENCY MOM: 0.88
Nuchal Translucency: 1 mm
Number of Fetuses: 1
PAPP-A MoM: 2.24
PAPP-A VALUE: 885.9 ng/mL
Test Results:: NEGATIVE
WEIGHT: 234 [lb_av]

## 2017-10-13 LAB — TYPE AND SCREEN
ABO/RH(D): A POS
Antibody Screen: NEGATIVE

## 2017-10-13 LAB — GLUCOSE, CAPILLARY
GLUCOSE-CAPILLARY: 101 mg/dL — AB (ref 70–99)
GLUCOSE-CAPILLARY: 62 mg/dL — AB (ref 70–99)
GLUCOSE-CAPILLARY: 64 mg/dL — AB (ref 70–99)
GLUCOSE-CAPILLARY: 103 mg/dL — AB (ref 70–99)
Glucose-Capillary: 79 mg/dL (ref 70–99)
Glucose-Capillary: 64 mg/dL — ABNORMAL LOW (ref 70–99)

## 2017-10-13 LAB — ABO/RH: ABO/RH(D): A POS

## 2017-10-13 SURGERY — CERCLAGE, CERVIX, VAGINAL APPROACH
Anesthesia: Spinal | Wound class: Clean Contaminated

## 2017-10-13 MED ORDER — FENTANYL CITRATE (PF) 100 MCG/2ML IJ SOLN
INTRAMUSCULAR | Status: AC
  Filled 2017-10-13: qty 2

## 2017-10-13 MED ORDER — FAMOTIDINE 20 MG PO TABS
20.0000 mg | ORAL_TABLET | Freq: Once | ORAL | Status: AC
  Administered 2017-10-13: 20 mg via ORAL

## 2017-10-13 MED ORDER — BUPIVACAINE HCL (PF) 0.75 % IJ SOLN
INTRAMUSCULAR | Status: DC | PRN
  Administered 2017-10-13: 1 mL

## 2017-10-13 MED ORDER — BUPIVACAINE IN DEXTROSE 0.75-8.25 % IT SOLN
INTRATHECAL | Status: DC | PRN
  Administered 2017-10-13: 1 mL via INTRATHECAL

## 2017-10-13 MED ORDER — FENTANYL CITRATE (PF) 100 MCG/2ML IJ SOLN: 25.0000 ug | INTRAMUSCULAR | Status: DC | PRN

## 2017-10-13 MED ORDER — FAMOTIDINE 20 MG PO TABS
ORAL_TABLET | ORAL | Status: AC
  Filled 2017-10-13: qty 1

## 2017-10-13 MED ORDER — LACTATED RINGERS IV SOLN
INTRAVENOUS | Status: DC
  Administered 2017-10-13: 12:00:00 via INTRAVENOUS

## 2017-10-13 MED ORDER — LACTATED RINGERS IV SOLN: INTRAVENOUS | Status: DC

## 2017-10-13 MED ORDER — ACETAMINOPHEN 500 MG PO TABS: 1000.0000 mg | ORAL_TABLET | Freq: Four times a day (QID) | ORAL | 2 refills | Status: DC | PRN

## 2017-10-13 MED ORDER — ONDANSETRON HCL 4 MG/2ML IJ SOLN: 4.0000 mg | Freq: Once | INTRAMUSCULAR | Status: DC | PRN

## 2017-10-13 SURGICAL SUPPLY — 17 items
CATH ROBINSON RED A/P 16FR (CATHETERS) ×3 IMPLANT
GLOVE BIOGEL PI IND STRL 6.5 (GLOVE) ×2 IMPLANT
GLOVE BIOGEL PI INDICATOR 6.5 (GLOVE) ×4
GLOVE SURG SYN 6.5 ES PF (GLOVE) ×9 IMPLANT
GOWN STRL REUS W/ TWL LRG LVL3 (GOWN DISPOSABLE) ×2 IMPLANT
GOWN STRL REUS W/TWL LRG LVL3 (GOWN DISPOSABLE) ×4
KIT TURNOVER CYSTO (KITS) ×3 IMPLANT
NS IRRIG 500ML POUR BTL (IV SOLUTION) ×3 IMPLANT
PACK DNC HYST (MISCELLANEOUS) ×3 IMPLANT
PAD OB MATERNITY 4.3X12.25 (PERSONAL CARE ITEMS) ×3 IMPLANT
PAD PREP 24X41 OB/GYN DISP (PERSONAL CARE ITEMS) ×3 IMPLANT
SPONGE XRAY 4X4 16PLY STRL (MISCELLANEOUS) ×3 IMPLANT
SUT MERSILENE 5MM BP 1 12 (SUTURE) IMPLANT
SUT POLY BUTTON 15MM (SUTURE) ×3 IMPLANT
SUT PROLENE 1 CT (SUTURE) IMPLANT
SUT PROLENE 2 TP 1 (SUTURE) IMPLANT
TOWEL OR 17X26 4PK STRL BLUE (TOWEL DISPOSABLE) ×3 IMPLANT

## 2017-10-13 NOTE — Progress Notes (Signed)
Negative, Released to mychart 

## 2017-10-13 NOTE — Anesthesia Procedure Notes (Addendum)
Spinal  Start time: 10/13/2017 1:12 PM End time: 10/13/2017 1:18 PM Staffing Anesthesiologist: Yves Dillarroll, Madox Corkins, MD Resident/CRNA: Manning CharityEvans, Darrell B, CRNA Performed: resident/CRNA  Spinal Block Patient position: sitting Prep: Betadine Patient monitoring: heart rate, continuous pulse ox and blood pressure Approach: midline Location: L3-4 Injection technique: single-shot Needle Needle type: Pencan  Needle gauge: 24 G Assessment Sensory level: L2

## 2017-10-13 NOTE — Anesthesia Preprocedure Evaluation (Addendum)
Anesthesia Evaluation  Patient identified by MRN, date of birth, ID band Patient awake    Reviewed: Allergy & Precautions, NPO status , Patient's Chart, lab work & pertinent test results  Airway Mallampati: III  TM Distance: >3 FB     Dental   Pulmonary    Pulmonary exam normal        Cardiovascular negative cardio ROS Normal cardiovascular exam     Neuro/Psych negative neurological ROS  negative psych ROS   GI/Hepatic Neg liver ROS, GERD  ,  Endo/Other  Morbid obesity  Renal/GU negative Renal ROS  negative genitourinary   Musculoskeletal   Abdominal Normal abdominal exam  (+)   Peds negative pediatric ROS (+)  Hematology negative hematology ROS (+)   Anesthesia Other Findings   Reproductive/Obstetrics (+) Pregnancy                             Anesthesia Physical Anesthesia Plan  ASA: II  Anesthesia Plan: Spinal   Post-op Pain Management:    Induction: Intravenous  PONV Risk Score and Plan:   Airway Management Planned: Nasal Cannula  Additional Equipment:   Intra-op Plan:   Post-operative Plan:   Informed Consent: I have reviewed the patients History and Physical, chart, labs and discussed the procedure including the risks, benefits and alternatives for the proposed anesthesia with the patient or authorized representative who has indicated his/her understanding and acceptance.   Dental advisory given  Plan Discussed with: CRNA and Surgeon  Anesthesia Plan Comments:        Anesthesia Quick Evaluation

## 2017-10-13 NOTE — Progress Notes (Signed)
Fetal heart tones were heard via doppler performed by Dr. Jerene PitchSchuman at bedside.  HR was about 160 per Dr. Jerene PitchSchuman.

## 2017-10-13 NOTE — Progress Notes (Signed)
Pt able to stand and ambulate to restroom , voided a small amount and walked back to discharge room, bladder scanned to check residual urine and 90cc resulted.

## 2017-10-13 NOTE — Op Note (Signed)
10/13/2017  2:02 PM  PATIENT:  Lindsay AlbertsMikesha A Wagner  27 y.o. female  PRE-OPERATIVE DIAGNOSIS:  HISTORY OF CERVICAL INSUFFICIENCY  POST-OPERATIVE DIAGNOSIS:  HISTORY OF CERVICAL INSUFFICIENCY  PROCEDURE:  Procedure(s): CERCLAGE CERVICAL (N/A)  SURGEON:  Jaquelyn Bitterhristanna R Schuman MD  ASSISTANTS: None  ANESTHESIA:   spinal  EBL: 10 cc  COMPLICATIONS: None      INDICATIONS: History of cervical incompitence   FINDINGS: Normal appearing cervix.  DICTATION: .Note written in EPIC Patient was taken to the OR where anesthesia was established. She was positioned into the dorsal lithotomy position with her feet in [employer]. She was prepped and draped in the usual sterile manner. A weighted speculum was inserted into the posterior vaginal vault and a right angle retractor was used to visualize the cervix. The above findings were noted. A McDonald cerclage was then placed using #1 Prolene Monofilament suture. The anterior lip of the cervix was grasped at the 12 and 9 o'clock position with an allis clamp. The suture was passed from 12 to 9 o'clock through the cervical tissue. The initial suture was placed at the junction of the rugated vaginal epithelium and smooth cervix just distal to the vesicocervical reflection and at least 2 cm above the external os, as high as surgically feasible. The allis clamps were then moved to the 9 and 6 o'clock positions and the suture was passed from the 9 to the 6 o'clock position. The allis clamps were then moved to the 6 and 3 o'clock positions and the uture was passed from the 6 to the 3 o'clock position. The allis clamps were then moved to the 3 and 12 o'clock positions and the suture was passed from the 3 to the 12 o'clock position. Twelve knots were then tied at the 12 o'clock position. Excellent hemostasis was achieved throughout the procedure. The cervix was visually closed at the end of the procedure. All instruments were removed from the vagina and the procedure was  discontinued. All counts were correct times two. The patient was taken to the recovery room in stable condition. There were no complications.    PLAN OF CARE: Discharge to home after PACU  PATIENT DISPOSITION:  PACU - hemodynamically stable.   Adelene Idlerhristanna Schuman MD [facility], [facility] 10/13/17 2:02 PM

## 2017-10-13 NOTE — H&P (Signed)
History and Physical  Lindsay Wagner is an 27 y.o. female.  HPI: She presents today for a cerclage placement. She is feeling well, no vaginal bleeding or cramping.  Fetal heart tones were 161 bpm at bedside.   Pregnancy #3 Problems (from 09/06/17 to present)    Problem Noted Resolved   History of preterm delivery 10/11/2017 by Lindsay Wagner, Lindsay Wagner R, MD No   Hyperemesis affecting pregnancy, antepartum 10/11/2017 by Lindsay Wagner, Lindsay Wagner R, MD No   Supervision of high risk pregnancy, antepartum, third trimester 09/06/2017 by Lindsay Wagner, Lindsay Wagner R, MD No   Overview Addendum 09/13/2017  1:14 PM by Lindsay Wagner, Colleen, CNM      Clinic [facility] Prenatal Labs  Dating 7wk6d ultrasound Blood type: A/Positive/-- (06/18 1039)   Genetic Screen 1 Screen:   Negative   Antibody:Negative (06/18 1039)  Anatomic US  Rubella: 1.81 (06/18 1039) Varicella: Immune  GTT Early: 1hr and 3hr ELEVATED          RPR: Non Reactive (06/18 1039)   Rhogam Not indicated HBsAg: Negative (06/18 1039)   TDaP vaccine                       HIV: Non Reactive (06/18 1039)   Flu Shot                                GBS:   Contraception  Pap:  CBB     CS/VBAC    Baby Food    Support Person             Morbid obesity (HCC) 07/04/2017 by Lindsay Wagner, Edithe Dobbin R, MD No   Overview Signed 10/11/2017 10:50 AM by Lindsay Wagner, Renie Stelmach R, MD    BMI >=40 [ X] early 1h gtt -  [ X] u/s for dating [ ]   [ ]  nutritional goals [ ]  folic acid 1mg  [ ]  bASA (>12 weeks) [ ]  consider nutrition consult [ ]  consider maternal EKG 1st trimester [ ]  Growth u/s 28 [ ] , 32 [ ] , 36 weeks [ ]  [ ]  NST/AFI weekly 36+ weeks (36[] , 37[] , 38[] , 39[] , 40[] ) [ ]  IOL by 41 weeks (scheduled, prn [] ) [ ]  anesthesia consult      History of cervical incompetence 07/04/2017 by Lindsay Wagner, Lindsay Wagner R, MD No   Overview Signed 10/11/2017 10:46 AM by Lindsay Wagner, Lindsay Wagner R, MD    [ ]  Cerclage at [redacted] weeks gestation          Past Medical History:  Diagnosis Date   . Bronchitis   . GERD (gastroesophageal reflux disease)    occ-no meds  . History of preterm delivery     Past Surgical History:  Procedure Laterality Date  . CERVICAL CERCLAGE  11/12/2010    Family History  Problem Relation Age of Onset  . Cancer Neg Hx   . Diabetes Neg Hx   . Hypertension Neg Hx   . Stroke Neg Hx   . Thyroid disease Neg Hx     Social History:  reports that she has never smoked. She has never used smokeless tobacco. She reports that she does not drink alcohol or use drugs.  Allergies: No Known Allergies  Medications: I have reviewed the patient's current medications.  Results for orders placed or performed during the hospital encounter of 10/13/17 (from the past 48 hour(s))  CBC     Status: None   Collection Time: 10/13/17 12:18 PM  Result  Value Ref Range   WBC 4.6 3.6 - 11.0 K/uL   RBC 4.09 3.80 - 5.20 MIL/uL   Hemoglobin 12.6 12.0 - 16.0 g/dL   HCT 16.1 09.6 - 04.5 %   MCV 88.9 80.0 - 100.0 fL   MCH 30.8 26.0 - 34.0 pg   MCHC 34.6 32.0 - 36.0 g/dL   RDW 40.9 81.1 - 91.4 %   Platelets 253 150 - 440 K/uL    Comment: Performed at [facility], [address] [place], [place] [address]  Glucose, capillary     Status: None   Collection Time: 10/13/17 12:40 PM  Result Value Ref Range   Glucose-Capillary 79 70 - 99 mg/dL    No results found.  Review of Systems  Constitutional: Negative for chills, fever, malaise/fatigue and weight loss.  HENT: Negative for congestion, hearing loss and sinus pain.   Eyes: Negative for blurred vision and double vision.  Respiratory: Negative for cough, sputum production, shortness of breath and wheezing.   Cardiovascular: Negative for chest pain, palpitations, orthopnea and leg swelling.  Gastrointestinal: Negative for abdominal pain, constipation, diarrhea, nausea and vomiting.  Genitourinary: Negative for dysuria, flank pain, frequency, hematuria and urgency.  Musculoskeletal: Negative for back pain,  falls and joint pain.  Skin: Negative for itching and rash.  Neurological: Negative for dizziness and headaches.  Psychiatric/Behavioral: Negative for depression, substance abuse and suicidal ideas. The patient is not nervous/anxious.    Blood pressure 117/69, pulse 80, temperature 98.7 F (37.1 C), temperature source Oral, resp. rate 16, height 5\' 5"  (1.651 m), weight 236 lb (107 kg), last menstrual period 07/25/2017, SpO2 100 %. Physical Exam  Nursing note and vitals reviewed. Constitutional: She is oriented to person, place, and time. She appears well-developed and well-nourished.  HENT:  Head: Normocephalic and atraumatic.  Cardiovascular: Normal rate and regular rhythm.  Respiratory: Effort normal and breath sounds normal.  GI: Soft. Bowel sounds are normal.  Musculoskeletal: Normal range of motion.  Neurological: She is alert and oriented to person, place, and time.  Skin: Skin is warm and dry.  Psychiatric: She has a normal mood and affect. Her behavior is normal. Judgment and thought content normal.    Assessment/Plan: 27 yo [id number] with a history of cervical incompetence at 12 weeks 1 day gestation by a 7 week [place] equal to her LMP. Will proceed with a cervical cerclage based on her history.   Lindsay Wagner R Lindsay Wagner 10/13/2017, 12:45 PM

## 2017-10-13 NOTE — Progress Notes (Signed)
Pt feels feet, able to wiggle toes , bending knees, stood but unable to bear weight at this time,

## 2017-10-13 NOTE — Discharge Instructions (Addendum)
AMBULATORY SURGERY  DISCHARGE INSTRUCTIONS   1) The drugs that you were given will stay in your system until tomorrow so for the next 24 hours you should not:  A) Drive an automobile B) Make any legal decisions C) Drink any alcoholic beverage   2) You may resume regular meals tomorrow.  Today it is better to start with liquids and gradually work up to solid foods.  You may eat anything you prefer, but it is better to start with liquids, then soup and crackers, and gradually work up to solid foods.   3) Please notify your doctor immediately if you have any unusual bleeding, trouble breathing, redness and pain at the surgery site, drainage, fever, or pain not relieved by medication.    4) Additional Instructions:        Please contact your physician with any problems or Same Day Surgery at [phone], Monday through Friday 6 am to 4 pm, or [place] at [facility] number at [phone].Cervical Cerclage, Care After This sheet gives you information about how to care for yourself after your procedure. Your health care provider may also give you more specific instructions. If you have problems or questions, contact your health care provider. What can I expect after the procedure? After your procedure, it is common to have:  Cramping in your abdomen.  Mucus discharge for several days.  Painful urination (dysuria).  Small drops of blood coming from your vagina (spotting).  Follow these instructions at home:  Follow instructions from your health care provider about bed rest, if this applies. You may need to be on bed rest for up to 3 days.  Take over-the-counter and prescription medicines only as told by your health care provider.  Do not drive or use heavy machinery while taking prescription pain medicine.  Keep track of your vaginal discharge and watch for any changes. If you notice changes, tell your health care provider.  Avoid physical activities and exercise  until your health care provider approves. Ask your health care provider what activities are safe for you.  Until your health care provider approves: ? Do not douche. ? Do not have sexual intercourse.  Keep all pre-birth (prenatal) visits and all follow-up visits as told by your health care provider. This is important. You will probably have weekly visits to have your cervix checked, and you may need an ultrasound. Contact a health care provider if:  You have abnormal or bad-smelling vaginal discharge, such as clots.  You develop a rash on your skin. This may look like redness and swelling.  You become light-headed or feel like you are going to faint.  You have abdominal pain that does not get better with medicine.  You have persistent nausea or vomiting. Get help right away if:  You have vaginal bleeding that is heavier or more frequent than spotting.  You are leaking fluid or have a gush of fluid from your vagina (your water breaks).  You have a fever or chills.  You faint.  You have uterine contractions. These may feel like: ? A back ache. ? Lower abdominal pain. ? Mild cramps, similar to menstrual cramps. ? Tightening or pressure in your abdomen.  You think that your baby is not moving as much as usual, or you cannot feel your baby move.  You have chest pain.  You have shortness of breath. This information is not intended to replace advice given to you by your health care provider. Make sure you discuss any questions  you have with your health care provider. Document Released: 12/27/2012 Document Revised: 11/05/2015 Document Reviewed: 10/10/2015 Elsevier Interactive Patient Education  [employer]2018 [employer]

## 2017-10-13 NOTE — Anesthesia Post-op Follow-up Note (Signed)
Anesthesia QCDR form completed.        

## 2017-10-13 NOTE — Transfer of Care (Signed)
Immediate Anesthesia Transfer of Care Note  Patient: Cleta AlbertsMikesha A Pinela  Procedure(s) Performed: CERCLAGE CERVICAL (N/A )  Patient Location: PACU  Anesthesia Type:Spinal  Level of Consciousness: awake, alert  and oriented  Airway & Oxygen Therapy: Patient Spontanous Breathing  Post-op Assessment: Report given to RN and Post -op Vital signs reviewed and stable  Post vital signs: Reviewed and stable  Last Vitals:  Vitals Value Taken Time  BP 102/68 10/13/2017  2:03 PM  Temp 36.2 C 10/13/2017  2:02 PM  Pulse 65 10/13/2017  2:09 PM  Resp 18 10/13/2017  2:09 PM  SpO2 100 % 10/13/2017  2:09 PM  Vitals shown include unvalidated device data.  Last Pain:  Vitals:   10/13/17 1402  TempSrc:   PainSc: 0-No pain         Complications: No apparent anesthesia complications

## 2017-10-14 ENCOUNTER — Encounter: Payer: Self-pay | Admitting: Obstetrics and Gynecology

## 2017-10-17 ENCOUNTER — Encounter: Payer: 59 | Admitting: Obstetrics and Gynecology

## 2017-10-18 NOTE — Anesthesia Postprocedure Evaluation (Signed)
Anesthesia Post Note  Patient: Lindsay AlbertsMikesha A Camero  Procedure(s) Performed: CERCLAGE CERVICAL (N/A )  Patient location during evaluation: PACU Anesthesia Type: Spinal Level of consciousness: awake and alert and oriented Pain management: pain level controlled Vital Signs Assessment: post-procedure vital signs reviewed and stable Respiratory status: spontaneous breathing Cardiovascular status: blood pressure returned to baseline Anesthetic complications: no     Last Vitals:  Vitals:   10/13/17 1525 10/13/17 1810  BP: 103/72 106/75  Pulse: 74 84  Resp: 20 20  Temp: (!) 36.1 C   SpO2: 100% 100%    Last Pain:  Vitals:   10/13/17 1525  TempSrc: Tympanic  PainSc: 0-No pain                 Journei Thomassen

## 2017-10-20 ENCOUNTER — Ambulatory Visit: Payer: 59 | Admitting: *Deleted

## 2017-10-25 ENCOUNTER — Encounter: Payer: Self-pay | Admitting: Obstetrics and Gynecology

## 2017-10-25 ENCOUNTER — Ambulatory Visit (INDEPENDENT_AMBULATORY_CARE_PROVIDER_SITE_OTHER): Payer: 59 | Admitting: Obstetrics and Gynecology

## 2017-10-25 VITALS — BP 116/64 | Wt 237.0 lb

## 2017-10-25 DIAGNOSIS — Z3A13 13 weeks gestation of pregnancy: Secondary | ICD-10-CM

## 2017-10-25 DIAGNOSIS — Z6841 Body Mass Index (BMI) 40.0 and over, adult: Secondary | ICD-10-CM

## 2017-10-25 DIAGNOSIS — O24419 Gestational diabetes mellitus in pregnancy, unspecified control: Secondary | ICD-10-CM | POA: Insufficient documentation

## 2017-10-25 DIAGNOSIS — Z8742 Personal history of other diseases of the female genital tract: Secondary | ICD-10-CM

## 2017-10-25 DIAGNOSIS — O210 Mild hyperemesis gravidarum: Secondary | ICD-10-CM

## 2017-10-25 DIAGNOSIS — Z8751 Personal history of pre-term labor: Secondary | ICD-10-CM

## 2017-10-25 DIAGNOSIS — O0993 Supervision of high risk pregnancy, unspecified, third trimester: Secondary | ICD-10-CM

## 2017-10-25 DIAGNOSIS — O24410 Gestational diabetes mellitus in pregnancy, diet controlled: Secondary | ICD-10-CM

## 2017-10-25 DIAGNOSIS — Z8759 Personal history of other complications of pregnancy, childbirth and the puerperium: Secondary | ICD-10-CM

## 2017-10-25 MED ORDER — GLUCOSE BLOOD VI STRP: ORAL_STRIP | 12 refills | Status: DC

## 2017-10-25 MED ORDER — ACCU-CHEK NANO SMARTVIEW W/DEVICE KIT: PACK | 0 refills | Status: DC

## 2017-10-25 MED ORDER — ACCU-CHEK SOFT TOUCH LANCETS MISC: 12 refills | Status: DC

## 2017-10-25 NOTE — Progress Notes (Incomplete)
Routine Prenatal Care Visit  Subjective  [name] is a 27 y.o. [phone] at [redacted]w[redacted]d being seen today for ongoing prenatal care.  She is currently monitored for the following issues for this {Blank single:19197::"high-risk","low-risk"} pregnancy and has BMI 40.0-44.9, adult (HCC); Morbid obesity (HCC); History of cervical incompetence; Supervision of high risk pregnancy, antepartum, third trimester; History of preterm delivery; Hyperemesis affecting pregnancy, antepartum; and Gestational diabetes on their problem list.  ----------------------------------------------------------------------------------- Patient reports {sx:14538}.    . Vag. Bleeding: None.  Movement: Absent. Denies leaking of fluid.  ----------------------------------------------------------------------------------- The following portions of the patient's history were reviewed and updated as appropriate: allergies, current medications, past family history, past medical history, past social history, past surgical history and problem list. Problem list updated.   Objective  Blood pressure 116/64, weight 237 lb (107.5 kg), last menstrual period 07/25/2017. Pregravid weight 245 lb (111.1 kg) Total Weight Gain -8 lb (-3.629 kg) Urinalysis: Urine Protein: 1+ Urine Glucose: Negative  Fetal Status: Fetal Heart Rate (bpm): present   Movement: Absent     General:  Alert, oriented and cooperative. Patient is in no acute distress.  Skin: Skin is warm and dry. No rash noted.   Cardiovascular: Normal heart rate noted  Respiratory: Normal respiratory effort, no problems with respiration noted  Abdomen: Soft, gravid, appropriate for gestational age. Pain/Pressure: Absent     Pelvic:  {Blank single:19197::"Cervical exam performed","Cervical exam deferred"}        Extremities: Normal range of motion.     Mental Status: Normal mood and affect. Normal behavior. Normal judgment and thought content.   Assessment   27 y.o. [id number] at [redacted]w[redacted]d  by  04/26/2018, by Ultrasound presenting for {Blank single:19197::"routine","work-in"} prenatal visit  Plan   Pregnancy #3 Problems (from 09/06/17 to present)    Problem Noted Resolved   Gestational diabetes 10/25/2017 by Conard Novak, MD No   History of preterm delivery 10/11/2017 by Natale Milch, MD No   Hyperemesis affecting pregnancy, antepartum 10/11/2017 by Natale Milch, MD No   Supervision of high risk pregnancy, antepartum, third trimester 09/06/2017 by Natale Milch, MD No   Overview Addendum 09/13/2017  1:14 PM by Farrel Conners, CNM      [facility]inic [facility] Prenatal [facility]  Dating 7wk6d ultrasound Blood type: A/Positive/-- (06/18 1039)   Genetic Screen 1 Screen:     AFP:      Quad:      NIPS:    Antibody:Negative (06/18 1039)  Anatomic [place]  Rubella: 1.81 (06/18 1039) Varicella: @VZVIGG @  GTT Early:        28 wk:      RPR: Non Reactive (06/18 1039)   Rhogam  HBsAg: Negative (06/18 1039)   TDaP vaccine                       HIV: Non Reactive (06/18 1039)   Flu Shot                                GBS:   Contraception  Pap:  CBB     CS/VBAC    Baby Food    Support Person             Morbid obesity (HCC) 07/04/2017 by Natale Milch, MD No   Overview Signed 10/11/2017 10:50 AM by Natale Milch, MD    BMI >=40 [ X] early 1h gtt -  [ ]   u/s for dating [ ]   [ ]  nutritional goals [ ]  folic acid 1mg  [ ]  bASA (>12 weeks) [ ]  consider nutrition consult [ ]  consider maternal EKG 1st trimester [ ]  Growth u/s 28 [ ] , 32 [ ] , 36 weeks [ ]  [ ]  NST/AFI weekly 36+ weeks (36[] , 37[] , 38[] , 39[] , 40[] ) [ ]  IOL by 41 weeks (scheduled, prn [] ) [ ]  anesthesia consult      History of cervical incompetence 07/04/2017 by Natale MilchSchuman, Christanna R, MD No   Overview Signed 10/11/2017 10:46 AM by Natale MilchSchuman, Christanna R, MD    [ ]  Cerclage at [redacted] weeks gestation          {Blank single:19197::"Term","Preterm"} labor symptoms and general obstetric  precautions including but not limited to vaginal bleeding, contractions, leaking of fluid and fetal movement were reviewed in detail with the patient. Please refer to After Visit Summary for other counseling recommendations.   Return in about 2 weeks (around 11/08/2017) for Routine Prenatal Appointment.  Thomasene MohairStephen Chadd Tollison, MD, Merlinda FrederickFACOG [facility], [facility] 10/25/2017 1:31 PM

## 2017-10-25 NOTE — Progress Notes (Addendum)
Routine Prenatal Care Visit  Subjective  Lindsay Wagner is a 27 y.o. [phone] at [redacted]w[redacted]d being seen today for ongoing prenatal care.  She is currently monitored for the following issues for this high-risk pregnancy and has BMI 40.0-44.9, adult (HCC); Morbid obesity (HCC); History of cervical incompetence; Supervision of high risk pregnancy, antepartum, third trimester; History of preterm delivery; Hyperemesis affecting pregnancy, antepartum; and Gestational diabetes on their problem list.  ----------------------------------------------------------------------------------- Patient reports no complaints.    . Vag. Bleeding: None.  Movement: Absent. Denies leaking of fluid.  GDM: patient did not attend her [facility] appointment. She states that she simply forgot and fully intends to call and reschedule. She states that she has the number. Cervical insufficiency: no symptoms since the placement of her stitch. No bleeding or cramping. Morbid obesity: has not started bASA ----------------------------------------------------------------------------------- The following portions of the patient's history were reviewed and updated as appropriate: allergies, current medications, past family history, past medical history, past social history, past surgical history and problem list. Problem list updated.   Objective  Blood pressure 116/64, weight 237 lb (107.5 kg), last menstrual period 07/25/2017. Pregravid weight 245 lb (111.1 kg) Total Weight Gain -8 lb (-3.629 kg) Urinalysis: Urine Protein: 1+ Urine Glucose: Negative  Fetal Status: Fetal Heart Rate (bpm): present   Movement: Absent     General:  Alert, oriented and cooperative. Patient is in no acute distress.  Skin: Skin is warm and dry. No rash noted.   Cardiovascular: Normal heart rate noted  Respiratory: Normal respiratory effort, no problems with respiration noted  Abdomen: Soft, gravid, appropriate for gestational age. Pain/Pressure: Absent      Pelvic:  Cervical exam deferred        Extremities: Normal range of motion.     Mental Status: Normal mood and affect. Normal behavior. Normal judgment and thought content.   Assessment   27 y.o. [id number] at [redacted]w[redacted]d by  04/26/2018, by Ultrasound presenting for routine prenatal visit  Plan   Pregnancy #3 Problems (from 09/06/17 to present)    Problem Noted Resolved   Gestational diabetes 10/25/2017 by Conard Novak, MD No   History of preterm delivery 10/11/2017 by Natale Milch, MD No   Hyperemesis affecting pregnancy, antepartum 10/11/2017 by Natale Milch, MD No   Supervision of high risk pregnancy, antepartum, third trimester 09/06/2017 by Natale Milch, MD No   Overview Addendum 09/13/2017  1:14 PM by Farrel Conners, CNM      [facility]atal [facility]  Dating 7wk6d ultrasound Blood type: A/Positive/-- (06/18 1039)   Genetic Screen 1 Screen:     AFP:      Quad:      NIPS:    Antibody:Negative (06/18 1039)  Anatomic [place]  Rubella: 1.81 (06/18 1039) Varicella: @VZVIGG @  GTT Early:        28 wk:      RPR: Non Reactive (06/18 1039)   Rhogam  HBsAg: Negative (06/18 1039)   TDaP vaccine                       HIV: Non Reactive (06/18 1039)   Flu Shot                                GBS:   Contraception  Pap:  CBB     CS/VBAC    Baby Food    Support Person  Morbid obesity (HCC) 07/04/2017 by Natale MilchSchuman, Christanna R, MD No   Overview Signed 10/11/2017 10:50 AM by Natale MilchSchuman, Christanna R, MD    BMI >=40 [ X] early 1h gtt -  [ ]  u/s for dating [ ]   [ ]  nutritional goals [ ]  folic acid 1mg  [ ]  bASA (>12 weeks) [ ]  consider nutrition consult [ ]  consider maternal EKG 1st trimester [ ]  Growth u/s 28 [ ] , 32 [ ] , 36 weeks [ ]  [ ]  NST/AFI weekly 36+ weeks (36[] , 37[] , 38[] , 39[] , 40[] ) [ ]  IOL by 41 weeks (scheduled, prn [] ) [ ]  anesthesia consult      History of cervical incompetence 07/04/2017 by Natale MilchSchuman, Christanna R, MD No   Overview  Signed 10/11/2017 10:46 AM by Natale MilchSchuman, Christanna R, MD    [ ]  Cerclage at [redacted] weeks gestation         Preterm labor symptoms and general obstetric precautions including but not limited to vaginal bleeding, contractions, leaking of fluid and fetal movement were reviewed in detail with the patient. Please refer to After Visit Summary for other counseling recommendations.   - Rx provided for glucometer and supplies. - patient to call [facility] to reschedule. She voiced understanding the importance of doing this. - patient instructed to begin taking bASA now daily.   Return in about 2 weeks (around 11/08/2017) for Routine Prenatal Appointment.  Thomasene MohairStephen Lillan Mccreadie, MD, Merlinda FrederickFACOG [facility], [facility] 10/25/2017 1:31 PM

## 2017-10-25 NOTE — Progress Notes (Incomplete)
Routine Prenatal Care Visit  Subjective  Lindsay Wagner is a 27 y.o. G3P0111 at [redacted]w[redacted]d being seen today for ongoing prenatal care.  She is currently monitored for the following issues for this {Blank single:19197::"high-risk","low-risk"} pregnancy and has BMI 40.0-44.9, adult (HCC); Morbid obesity (HCC); History of cervical incompetence; Supervision of high risk pregnancy, antepartum, third trimester; History of preterm delivery; Hyperemesis affecting pregnancy, antepartum; and Gestational diabetes on their problem list.  ----------------------------------------------------------------------------------- Patient reports {sx:14538}.    . Vag. Bleeding: None.  Movement: Absent. Denies leaking of fluid.  ----------------------------------------------------------------------------------- The following portions of the patient's history were reviewed and updated as appropriate: allergies, current medications, past family history, past medical history, past social history, past surgical history and problem list. Problem list updated.   Objective  Blood pressure 116/64, weight 237 lb (107.5 kg), last menstrual period 07/25/2017. Pregravid weight 245 lb (111.1 kg) Total Weight Gain -8 lb (-3.629 kg) Urinalysis: Urine Protein: 1+ Urine Glucose: Negative  Fetal Status: Fetal Heart Rate (bpm): present   Movement: Absent     General:  Alert, oriented and cooperative. Patient is in no acute distress.  Skin: Skin is warm and dry. No rash noted.   Cardiovascular: Normal heart rate noted  Respiratory: Normal respiratory effort, no problems with respiration noted  Abdomen: Soft, gravid, appropriate for gestational age. Pain/Pressure: Absent     Pelvic:  {Blank single:19197::"Cervical exam performed","Cervical exam deferred"}        Extremities: Normal range of motion.     Mental Status: Normal mood and affect. Normal behavior. Normal judgment and thought content.   Assessment   27 y.o. G3P0111 at [redacted]w[redacted]d  by  04/26/2018, by Ultrasound presenting for {Blank single:19197::"routine","work-in"} prenatal visit  Plan   Pregnancy #3 Problems (from 09/06/17 to present)    Problem Noted Resolved   Gestational diabetes 10/25/2017 by Juanpablo Ciresi D, MD No   History of preterm delivery 10/11/2017 by Schuman, Christanna R, MD No   Hyperemesis affecting pregnancy, antepartum 10/11/2017 by Schuman, Christanna R, MD No   Supervision of high risk pregnancy, antepartum, third trimester 09/06/2017 by Schuman, Christanna R, MD No   Overview Addendum 09/13/2017  1:14 PM by Gutierrez, Colleen, CNM      [facility]  Dating 7wk6d ultrasound Blood type: A/Positive/-- (06/18 1039)   Genetic Screen 1 Screen:     AFP:      Quad:      NIPS:    Antibody:Negative (06/18 1039)  Anatomic US  Rubella: 1.81 (06/18 1039) Varicella: @VZVIGG@  GTT Early:        28 wk:      RPR: Non Reactive (06/18 1039)   Rhogam  HBsAg: Negative (06/18 1039)   TDaP vaccine                       HIV: Non Reactive (06/18 1039)   Flu Shot                                GBS:   Contraception  Pap:  CBB     CS/VBAC    Baby Food    Support Person             Morbid obesity (HCC) 07/04/2017 by Schuman, Christanna R, MD No   Overview Signed 10/11/2017 10:50 AM by Schuman, Christanna R, MD    BMI >=40 [ X] early 1h gtt -  [ ]   u/s for dating [ ]  [ ] nutritional goals [ ] folic acid 1mg [ ] bASA (>12 weeks) [ ] consider nutrition consult [ ] consider maternal EKG 1st trimester [ ] Growth u/s 28 [ ], 32 [ ], 36 weeks [ ] [ ] NST/AFI weekly 36+ weeks (36[], 37[], 38[], 39[], 40[]) [ ] IOL by 41 weeks (scheduled, prn []) [ ] anesthesia consult      History of cervical incompetence 07/04/2017 by Schuman, Christanna R, MD No   Overview Signed 10/11/2017 10:46 AM by Schuman, Christanna R, MD    [ ] Cerclage at [redacted] weeks gestation          {Blank single:19197::"Term","Preterm"} labor symptoms and general obstetric  precautions including but not limited to vaginal bleeding, contractions, leaking of fluid and fetal movement were reviewed in detail with the patient. Please refer to After Visit Summary for other counseling recommendations.   Return in about 2 weeks (around 11/08/2017) for Routine Prenatal Appointment.  Katrinia Straker, MD, FACOG [facility], [facility] 10/25/2017 1:31 PM   

## 2017-11-08 ENCOUNTER — Ambulatory Visit (INDEPENDENT_AMBULATORY_CARE_PROVIDER_SITE_OTHER): Payer: 59 | Admitting: Obstetrics and Gynecology

## 2017-11-08 ENCOUNTER — Encounter: Payer: Self-pay | Admitting: Obstetrics and Gynecology

## 2017-11-08 VITALS — BP 104/60 | Wt 235.5 lb

## 2017-11-08 DIAGNOSIS — Z6841 Body Mass Index (BMI) 40.0 and over, adult: Secondary | ICD-10-CM

## 2017-11-08 DIAGNOSIS — O1211 Gestational proteinuria, first trimester: Secondary | ICD-10-CM

## 2017-11-08 DIAGNOSIS — Z8742 Personal history of other diseases of the female genital tract: Secondary | ICD-10-CM

## 2017-11-08 DIAGNOSIS — Z8751 Personal history of pre-term labor: Secondary | ICD-10-CM

## 2017-11-08 DIAGNOSIS — O0993 Supervision of high risk pregnancy, unspecified, third trimester: Secondary | ICD-10-CM

## 2017-11-08 DIAGNOSIS — Z8759 Personal history of other complications of pregnancy, childbirth and the puerperium: Secondary | ICD-10-CM

## 2017-11-08 DIAGNOSIS — Z3A15 15 weeks gestation of pregnancy: Secondary | ICD-10-CM

## 2017-11-08 DIAGNOSIS — O24410 Gestational diabetes mellitus in pregnancy, diet controlled: Secondary | ICD-10-CM

## 2017-11-08 LAB — POCT URINALYSIS DIPSTICK OB: GLUCOSE, UA: NEGATIVE — AB

## 2017-11-08 MED ORDER — ASPIRIN EC 81 MG PO TBEC: 81.0000 mg | DELAYED_RELEASE_TABLET | Freq: Every day | ORAL | 2 refills | Status: DC

## 2017-11-08 NOTE — Progress Notes (Signed)
Routine Prenatal Care Visit  Subjective  Lindsay Wagner is a 27 y.o. [phone] at 3950w6d being seen today for ongoing prenatal care.  She is currently monitored for the following issues for this high-risk pregnancy and has BMI 40.0-44.9, adult (HCC); Morbid obesity (HCC); History of cervical incompetence; Supervision of high risk pregnancy, antepartum, third trimester; History of preterm delivery; Hyperemesis affecting pregnancy, antepartum; and Gestational diabetes on their problem list.  ----------------------------------------------------------------------------------- Patient reports no complaints.   Contractions: Not present. Vag. Bleeding: None.  Movement: Absent. Denies leaking of fluid.  ----------------------------------------------------------------------------------- The following portions of the patient's history were reviewed and updated as appropriate: allergies, current medications, past family history, past medical history, past social history, past surgical history and problem list. Problem list updated.   Objective  Blood pressure 104/60, weight 235 lb 8 oz (106.8 kg), last menstrual period 07/25/2017. Pregravid weight 245 lb (111.1 kg) Total Weight Gain -9 lb 8 oz (-4.309 kg) Urinalysis:      Fetal Status: Fetal Heart Rate (bpm): 150   Movement: Absent     General:  Alert, oriented and cooperative. Patient is in no acute distress.  Skin: Skin is warm and dry. No rash noted.   Cardiovascular: Normal heart rate noted  Respiratory: Normal respiratory effort, no problems with respiration noted  Abdomen: Soft, gravid, appropriate for gestational age. Pain/Pressure: Absent     Pelvic:  Cervical exam deferred        Extremities: Normal range of motion.     ental Status: Normal mood and affect. Normal behavior. Normal judgment and thought content.     Assessment   27 y.o. [id number] at 6450w6d by  04/26/2018, by Ultrasound presenting for routine prenatal visit  Plan    Pregnancy #3 Problems (from 09/06/17 to present)    Problem Noted Resolved   Gestational diabetes 10/25/2017 by Conard NovakJackson, Stephen D, MD No   History of preterm delivery 10/11/2017 by Natale MilchSchuman, Christanna R, MD No   Hyperemesis affecting pregnancy, antepartum 10/11/2017 by Natale MilchSchuman, Christanna R, MD No   Supervision of high risk pregnancy, antepartum, third trimester 09/06/2017 by Natale MilchSchuman, Christanna R, MD No   Overview Addendum 09/13/2017  1:14 PM by Farrel ConnersGutierrez, Colleen, CNM      [facility]  Dating 7wk6d ultrasound Blood type: A/Positive/-- (06/18 1039)   Genetic Screen 1 Screen:     AFP:      Quad:      NIPS:    Antibody:Negative (06/18 1039)  Anatomic US  Rubella: 1.81 (06/18 1039) Varicella: immune  GTT Early:        28 wk:      RPR: Non Reactive (06/18 1039)   Rhogam  HBsAg: Negative (06/18 1039)   TDaP vaccine                       HIV: Non Reactive (06/18 1039)   Flu Shot                                GBS:   Contraception  Pap:  CBB     CS/VBAC    Baby Food    Support Person             Morbid obesity (HCC) 07/04/2017 by Natale MilchSchuman, Christanna R, MD No   Overview Signed 10/11/2017 10:50 AM by Natale MilchSchuman, Christanna R, MD    BMI >=40 [ X] early 1h gtt -  [ ]   u/s for dating [ ]   [ ]  nutritional goals [ ]  folic acid 1mg  [ ]  bASA (>12 weeks) [ ]  consider nutrition consult [ ]  consider maternal EKG 1st trimester [ ]  Growth u/s 28 [ ] , 32 [ ] , 36 weeks [ ]  [ ]  NST/AFI weekly 36+ weeks (36[] , 37[] , 38[] , 39[] , 40[] ) [ ]  IOL by 41 weeks (scheduled, prn [] ) [ ]  anesthesia consult      History of cervical incompetence 07/04/2017 by Natale MilchSchuman, Christanna R, MD No   Overview Signed 10/11/2017 10:46 AM by Natale MilchSchuman, Christanna R, MD    [ ]  Cerclage at [redacted] weeks gestation          Gestational age appropriate obstetric precautions including but not limited to vaginal bleeding, contractions, leaking of fluid and fetal movement were reviewed in detail with the patient.     Will collect a 24 hour urine collection, patient has had persistent proteinuria on multiple urine dips.      Reviewed blood glucose values in the application on her phone. She has been testing since the 12th of August. The values that are recorded are all within normal limits. She is not currently on any medication.   Discussed initiating baby ASA at 12 weeks.   Discussed with her that she has not gone to the diabetic teaching program. She told me she has not gone because it was not covered by her insurance.   Return in about 2 weeks (around 11/22/2017) for ROB.  Adelene Idlerhristanna Schuman MD [facility], [facility] 11/08/17 7:47 PM

## 2017-11-08 NOTE — Progress Notes (Signed)
ROB No concerns 

## 2017-11-22 ENCOUNTER — Ambulatory Visit (INDEPENDENT_AMBULATORY_CARE_PROVIDER_SITE_OTHER): Payer: 59 | Admitting: Maternal Newborn

## 2017-11-22 VITALS — BP 100/60 | Wt 232.0 lb

## 2017-11-22 DIAGNOSIS — Z3A17 17 weeks gestation of pregnancy: Secondary | ICD-10-CM

## 2017-11-22 DIAGNOSIS — Z3689 Encounter for other specified antenatal screening: Secondary | ICD-10-CM

## 2017-11-22 DIAGNOSIS — O0993 Supervision of high risk pregnancy, unspecified, third trimester: Secondary | ICD-10-CM

## 2017-11-22 DIAGNOSIS — O1212 Gestational proteinuria, second trimester: Secondary | ICD-10-CM

## 2017-11-22 NOTE — Progress Notes (Signed)
Routine Prenatal Care Visit  Subjective  Lindsay Wagner is a 27 y.o. [phone] at [redacted]w[redacted]d being seen today for ongoing prenatal care.  She is currently monitored for the following issues for this high-risk pregnancy and has BMI 40.0-44.9, adult (HCC); Morbid obesity (HCC); History of cervical incompetence; Supervision of high risk pregnancy, antepartum, third trimester; History of preterm delivery; Hyperemesis affecting pregnancy, antepartum; and Gestational diabetes on their problem list.  ----------------------------------------------------------------------------------- Patient reports no complaints. She brought a 24 hour urine specimen for testing today. Contractions: Not present. Vag. Bleeding: None.  Movement: Present. No leaking of fluid.  ----------------------------------------------------------------------------------- The following portions of the patient's history were reviewed and updated as appropriate: allergies, current medications, past family history, past medical history, past social history, past surgical history and problem list. Problem list updated.  Objective  Blood pressure 100/60, weight 232 lb (105.2 kg), last menstrual period 07/25/2017. Pregravid weight 245 lb (111.1 kg) Total Weight Gain -13 lb (-5.897 kg) Body mass index is 38.61 kg/m.  Fetal Status: Fetal Heart Rate (bpm): 152   Movement: Present     General:  Alert, oriented and cooperative. Patient is in no acute distress.  Skin: Skin is warm and dry. No rash noted.   Cardiovascular: Normal heart rate noted  Respiratory: Normal respiratory effort, no problems with respiration noted  Abdomen: Soft, gravid, appropriate for gestational age. Pain/Pressure: Absent     Pelvic:  Cervical exam deferred        Extremities: Normal range of motion.  Edema: None  Mental Status: Normal mood and affect. Normal behavior. Normal judgment and thought content.     Assessment   27 y.o. A5W0981 at [redacted]w[redacted]d, EDD 04/26/2018 by  Ultrasound presenting for a routine prenatal visit.  Plan   Pregnancy #3 Problems (from 09/06/17 to present)    Problem Noted Resolved   Gestational diabetes 10/25/2017 by Conard Novak, MD No   History of preterm delivery 10/11/2017 by Natale Milch, MD No   Hyperemesis affecting pregnancy, antepartum 10/11/2017 by Natale Milch, MD No   Supervision of high risk pregnancy, antepartum, third trimester 09/06/2017 by Natale Milch, MD No   Overview Addendum 11/08/2017  8:51 PM by Natale Milch, MD      [facility]  Dating 7wk6d ultrasound Blood type: A/Positive/-- (06/18 1039)   Genetic Screen 1 Screen:    Negative Antibody:Negative (06/18 1039)  Anatomic [place]  Rubella: 1.81 (06/18 1039) Varicella: Immune  GTT Early: 1hr and 3 hr elevated       28 wk:      RPR: Non Reactive (06/18 1039)   Rhogam  HBsAg: Negative (06/18 1039)   TDaP vaccine                       HIV: Non Reactive (06/18 1039)   Flu Shot                                GBS:   Contraception  Pap: 2017 NIL  CBB     CS/VBAC    Baby Food    Support Person   Husband Christiane Ha     CERCLAGE at 12 weeks       Morbid obesity (HCC) 07/04/2017 by Natale Milch, MD No   Overview Signed 10/11/2017 10:50 AM by Natale Milch, MD    BMI >=40 [ X] early 1h gtt -  [ ]   u/s for dating [ ]   [ ]  nutritional goals [ ]  folic acid 1mg  [ ]  bASA (>12 weeks) [ ]  consider nutrition consult [ ]  consider maternal EKG 1st trimester [ ]  Growth u/s 28 [ ] , 32 [ ] , 36 weeks [ ]  [ ]  NST/AFI weekly 36+ weeks (36[] , 37[] , 38[] , 39[] , 40[] ) [ ]  IOL by 41 weeks (scheduled, prn [] ) [ ]  anesthesia consult      History of cervical incompetence 07/04/2017 by Natale Milch, MD No   Overview Signed 10/11/2017 10:46 AM by Natale Milch, MD    [ ]  Cerclage at [redacted] weeks gestation         Please refer to After Visit Summary for other counseling recommendations.   Return in  about 2 weeks (around 12/06/2017) for ROB and anatomy scan.  [name], CNM 11/22/2017  11:26 AM

## 2017-11-22 NOTE — Patient Instructions (Signed)

## 2017-11-23 LAB — URINE DRUG PANEL 7
Amphetamines, Urine: NEGATIVE ng/mL
Barbiturate Quant, Ur: NEGATIVE ng/mL
Benzodiazepine Quant, Ur: NEGATIVE ng/mL
Cannabinoid Quant, Ur: NEGATIVE ng/mL
Cocaine (Metab.): NEGATIVE ng/mL
Opiate Quant, Ur: NEGATIVE ng/mL
PCP QUANT UR: NEGATIVE ng/mL

## 2017-11-23 LAB — CREATININE, URINE, 24 HOUR
CREATININE 24H UR: 1159 mg/(24.h) (ref 800–1800)
CREATININE, UR: 289.8 mg/dL

## 2017-11-23 LAB — PROTEIN, URINE, 24 HOUR
PROTEIN UR: 22.8 mg/dL
Protein, 24H Urine: 91 mg/24 hr (ref 30–150)

## 2017-11-23 NOTE — Progress Notes (Signed)
Lab asked me to enter these orders yesterday. Sending over to you so you can see the results.

## 2017-12-07 ENCOUNTER — Encounter: Payer: Self-pay | Admitting: Obstetrics and Gynecology

## 2017-12-07 ENCOUNTER — Ambulatory Visit (INDEPENDENT_AMBULATORY_CARE_PROVIDER_SITE_OTHER): Payer: 59 | Admitting: Obstetrics and Gynecology

## 2017-12-07 ENCOUNTER — Ambulatory Visit (INDEPENDENT_AMBULATORY_CARE_PROVIDER_SITE_OTHER): Payer: 59

## 2017-12-07 VITALS — BP 110/60 | Wt 228.0 lb

## 2017-12-07 DIAGNOSIS — Z363 Encounter for antenatal screening for malformations: Secondary | ICD-10-CM | POA: Diagnosis not present

## 2017-12-07 DIAGNOSIS — Z8742 Personal history of other diseases of the female genital tract: Secondary | ICD-10-CM

## 2017-12-07 DIAGNOSIS — Z23 Encounter for immunization: Secondary | ICD-10-CM | POA: Diagnosis not present

## 2017-12-07 DIAGNOSIS — O0993 Supervision of high risk pregnancy, unspecified, third trimester: Secondary | ICD-10-CM

## 2017-12-07 DIAGNOSIS — O24414 Gestational diabetes mellitus in pregnancy, insulin controlled: Secondary | ICD-10-CM

## 2017-12-07 DIAGNOSIS — O99212 Obesity complicating pregnancy, second trimester: Secondary | ICD-10-CM

## 2017-12-07 DIAGNOSIS — Z8759 Personal history of other complications of pregnancy, childbirth and the puerperium: Secondary | ICD-10-CM

## 2017-12-07 DIAGNOSIS — Z6841 Body Mass Index (BMI) 40.0 and over, adult: Secondary | ICD-10-CM

## 2017-12-07 DIAGNOSIS — O3430 Maternal care for cervical incompetence, unspecified trimester: Secondary | ICD-10-CM | POA: Insufficient documentation

## 2017-12-07 DIAGNOSIS — Z3A20 20 weeks gestation of pregnancy: Secondary | ICD-10-CM

## 2017-12-07 DIAGNOSIS — O3432 Maternal care for cervical incompetence, second trimester: Secondary | ICD-10-CM

## 2017-12-07 DIAGNOSIS — O09292 Supervision of pregnancy with other poor reproductive or obstetric history, second trimester: Secondary | ICD-10-CM

## 2017-12-07 DIAGNOSIS — Z3689 Encounter for other specified antenatal screening: Secondary | ICD-10-CM

## 2017-12-07 HISTORY — DX: Maternal care for cervical incompetence, second trimester: O34.32

## 2017-12-07 LAB — POCT URINALYSIS DIPSTICK OB: GLUCOSE, UA: NEGATIVE

## 2017-12-07 NOTE — Progress Notes (Signed)
ROB No concerns  Flu shot today  

## 2017-12-07 NOTE — Progress Notes (Signed)
Routine Prenatal Care Visit  Subjective  Lindsay Wagner is a 27 y.o. [phone] at 4868w0d being seen today for ongoing prenatal care.  She is currently monitored for theCleta Wagner following issues for this high-risk pregnancy and has BMI 40.0-44.9, adult (HCC); Morbid obesity (HCC); History of cervical incompetence; Supervision of high risk pregnancy, antepartum, third trimester; History of preterm delivery; Hyperemesis affecting pregnancy, antepartum; Gestational diabetes; and Cervical cerclage suture present on their problem list.  ----------------------------------------------------------------------------------- Patient reports no complaints.    .  .   . Denies leaking of fluid.  ----------------------------------------------------------------------------------- The following portions of the patient's history were reviewed and updated as appropriate: allergies, current medications, past family history, past medical history, past social history, past surgical history and problem list. Problem list updated.   Objective  Blood pressure 110/60, weight 228 lb (103.4 kg), last menstrual period 07/25/2017. Pregravid weight 245 lb (111.1 kg) Total Weight Gain -17 lb (-7.711 kg) Urinalysis:      Fetal Status:           General:  Alert, oriented and cooperative. Patient is in no acute distress.  Skin: Skin is warm and dry. No rash noted.   Cardiovascular: Normal heart rate noted  Respiratory: Normal respiratory effort, no problems with respiration noted  Abdomen: Soft, gravid, appropriate for gestational age.       Pelvic:  Cervical exam deferred        Extremities: Normal range of motion.     ental Status: Normal mood and affect. Normal behavior. Normal judgment and thought content.     Assessment   27 y.o. [id number] at 6868w0d by  04/26/2018, by Ultrasound presenting for routine prenatal visit  Plan   Pregnancy #3 Problems (from 09/06/17 to present)    Problem Noted Resolved   Cervical cerclage  suture present 12/07/2017 by Natale MilchSchuman, [name], MD No   Gestational diabetes 10/25/2017 by Conard NovakJackson, Stephen D, MD No   History of preterm delivery 10/11/2017 by Natale MilchSchuman, Jemimah Cressy R, MD No   Hyperemesis affecting pregnancy, antepartum 10/11/2017 by Natale MilchSchuman, Sapir Lavey R, MD No   Supervision of high risk pregnancy, antepartum, third trimester 09/06/2017 by Natale MilchSchuman, Berle Fitz R, MD No   Overview Addendum 11/08/2017  8:51 PM by Natale MilchSchuman, Desteny Freeman R, MD      [facility]  Dating 7wk6d ultrasound Blood type: A/Positive/-- (06/18 1039)   Genetic Screen 1 Screen:    Negative Antibody:Negative (06/18 1039)  Anatomic US  Rubella: 1.81 (06/18 1039) Varicella: Immune  GTT Early: 1hr and 3 hr elevated       28 wk:      RPR: Non Reactive (06/18 1039)   Rhogam  HBsAg: Negative (06/18 1039)   TDaP vaccine                       HIV: Non Reactive (06/18 1039)   Flu Shot                                GBS:   Contraception  Pap: 2017 NIL  CBB     CS/VBAC    Baby Food    Support Person   Husband Christiane HaJonathan     CERCLAGE at 12 weeks       Morbid obesity (HCC) 07/04/2017 by Natale MilchSchuman, Lova Urbieta R, MD No   Overview Signed 10/11/2017 10:50 AM by Natale MilchSchuman, Bryelle Spiewak R, MD    BMI >=40 [  X] early 1h gtt -  [ ]  u/s for dating [ ]   [ ]  nutritional goals [ ]  folic acid 1mg  [ ]  bASA (>12 weeks) [ ]  consider nutrition consult [ ]  consider maternal EKG 1st trimester [ ]  Growth u/s 28 [ ] , 32 [ ] , 36 weeks [ ]  [ ]  NST/AFI weekly 36+ weeks (36[] , 37[] , 38[] , 39[] , 40[] ) [ ]  IOL by 41 weeks (scheduled, prn [] ) [ ]  anesthesia consult      History of cervical incompetence 07/04/2017 by Natale Milch, MD No   Overview Signed 10/11/2017 10:46 AM by Natale Milch, MD    [ ]  Cerclage at [redacted] weeks gestation          Gestational age appropriate obstetric precautions including but not limited to vaginal bleeding, contractions, leaking of fluid and fetal movement were reviewed in detail  with the patient.    She did not bring her glucose logs with her. She reports that she is checking her blood sugars, although the results are not stored on her glucometer. I gave her a paper book and asked to to track her glucose levels in the book. I showed her how to send me a picture of her glucose log using the mychart app and asked her to send me a picture of the log in 1 week.  Will consider obtaining a hgba1c if needed.   She would like a tubal ligation- will need to sign papers. Discussed since she is under 30 there is usually a higher rate of regret. Gave her a book to review all of her contraceptive options.   She would like to breast feed. Discussed lactation consultation and provided her with breastfeeding resources. She breast feed for 2 days with her last pregnancy but stopped because of difficulties with latch.   Return in about 2 weeks (around 12/21/2017) for ROB and [place].  Adelene Idler MD [facility], [facility] 12/07/17 12:36 PM

## 2017-12-21 ENCOUNTER — Ambulatory Visit (INDEPENDENT_AMBULATORY_CARE_PROVIDER_SITE_OTHER): Payer: 59 | Admitting: Maternal Newborn

## 2017-12-21 ENCOUNTER — Encounter: Payer: Self-pay | Admitting: Maternal Newborn

## 2017-12-21 ENCOUNTER — Ambulatory Visit (INDEPENDENT_AMBULATORY_CARE_PROVIDER_SITE_OTHER): Payer: 59

## 2017-12-21 VITALS — BP 120/80 | Wt 226.0 lb

## 2017-12-21 DIAGNOSIS — O0993 Supervision of high risk pregnancy, unspecified, third trimester: Secondary | ICD-10-CM

## 2017-12-21 DIAGNOSIS — Z362 Encounter for other antenatal screening follow-up: Secondary | ICD-10-CM | POA: Diagnosis not present

## 2017-12-21 DIAGNOSIS — O09292 Supervision of pregnancy with other poor reproductive or obstetric history, second trimester: Secondary | ICD-10-CM

## 2017-12-21 DIAGNOSIS — Z3A22 22 weeks gestation of pregnancy: Secondary | ICD-10-CM | POA: Diagnosis not present

## 2017-12-21 LAB — POCT URINALYSIS DIPSTICK OB: Glucose, UA: NEGATIVE

## 2017-12-21 NOTE — Progress Notes (Signed)
Routine Prenatal Care Visit  Subjective  Lindsay Wagner is a 27 y.o. [phone] at [redacted]w[redacted]d being seen today for ongoing prenatal care.  She is currently monitored for the following issues for this high-risk pregnancy and has BMI 40.0-44.9, adult (HCC); Morbid obesity (HCC); History of cervical incompetence; Supervision of high risk pregnancy, antepartum, third trimester; History of preterm delivery; Hyperemesis affecting pregnancy, antepartum; Gestational diabetes; and Cervical cerclage suture present on their problem list.  ----------------------------------------------------------------------------------- Patient reports no complaints.  Reports that BG values have been within normal limits but did not bring paper log to visit today. Contractions: Not present. Vag. Bleeding: None.  Movement: Present. No leaking of fluid.  ----------------------------------------------------------------------------------- The following portions of the patient's history were reviewed and updated as appropriate: allergies, current medications, past family history, past medical history, past social history, past surgical history and problem list. Problem list updated.   Objective  Blood pressure 120/80, weight 226 lb (102.5 kg), last menstrual period 07/25/2017. Pregravid weight 245 lb (111.1 kg) Total Weight Gain -19 lb (-8.618 kg) Body mass index is 37.61 kg/m. Urinalysis: Protein Small, Glucose Negative Fetal Status:     Movement: Present     General:  Alert, oriented and cooperative. Patient is in no acute distress.  Skin: Skin is warm and dry. No rash noted.   Cardiovascular: Normal heart rate noted  Respiratory: Normal respiratory effort, no problems with respiration noted  Abdomen: Soft, gravid, appropriate for gestational age. Pain/Pressure: Absent     Pelvic:  Cervical exam deferred        Extremities: Normal range of motion.     Mental Status: Normal mood and affect. Normal behavior. Normal judgment  and thought content.     Assessment   27 y.o. [id number] at [redacted]w[redacted]d, EDD 04/26/2018 by Ultrasound presenting for a routine prenatal visit.  Plan   Pregnancy #3 Problems (from 09/06/17 to present)    Problem Noted Resolved   Cervical cerclage suture present 12/07/2017 by Natale Milch, MD No   Gestational diabetes 10/25/2017 by Conard Novak, MD No   History of preterm delivery 10/11/2017 by Natale Milch, MD No   Hyperemesis affecting pregnancy, antepartum 10/11/2017 by Natale Milch, MD No   Supervision of high risk pregnancy, antepartum, third trimester 09/06/2017 by Natale Milch, MD No   Overview Addendum 12/07/2017 12:43 PM by Natale Milch, MD      [facility]  Dating 7wk6d ultrasound Blood type: A/Positive/-- (06/18 1039)   Genetic Screen 1 Screen:    Negative Antibody:Negative (06/18 1039)  Anatomic [place] incomplete Rubella: 1.81 (06/18 1039) Varicella: Immune  GTT Early: 1hr and 3 hr elevated       28 wk:      RPR: Non Reactive (06/18 1039)   Rhogam  not indicated HBsAg: Negative (06/18 1039)   TDaP vaccine                       HIV: Non Reactive (06/18 1039)   Flu Shot 12/07/17 GBS:   Contraception Tubal [ ]  sign consent Pap: 2017 NIL  CBB     CS/VBAC Not applicable   Baby Food Breast, tried before for 2 days but had difficulty with latching   Support Person   Husband [name] at 12 weeks       Morbid obesity (HCC) 07/04/2017 by Natale Milch, MD No   Overview Signed 10/11/2017 10:50 AM by  Schuman, Christanna R, MD    BMI >=40 [ X] early 1h gtt -  [ ]  u/s for dating [ ]   [ ]  nutritional goals [ ]  folic acid 1mg  [ ]  bASA (>12 weeks) [ ]  consider nutrition consult [ ]  consider maternal EKG 1st trimester [ ]  Growth u/s 28 [ ] , 32 [ ] , 36 weeks [ ]  [ ]  NST/AFI weekly 36+ weeks (36[] , 37[] , 38[] , 39[] , 40[] ) [ ]  IOL by 41 weeks (scheduled, prn [] ) [ ]  anesthesia consult      History of  cervical incompetence 07/04/2017 by Natale Milch, MD No   Overview Signed 10/11/2017 10:46 AM by Natale Milch, MD    [ ]  Cerclage at [redacted] weeks gestation       Anatomy scan complete and normal today; breech presentation.   Requested photo of log be sent through MyChart so that we can have a record of normal blood glucose values.  Return in about 2 weeks (around 01/04/2018) for ROB.  [name], CNM 12/21/2017  10:54 AM

## 2018-01-04 ENCOUNTER — Ambulatory Visit (INDEPENDENT_AMBULATORY_CARE_PROVIDER_SITE_OTHER): Payer: 59 | Admitting: Obstetrics and Gynecology

## 2018-01-04 VITALS — BP 100/66 | Wt 229.0 lb

## 2018-01-04 DIAGNOSIS — O09212 Supervision of pregnancy with history of pre-term labor, second trimester: Secondary | ICD-10-CM

## 2018-01-04 DIAGNOSIS — O24414 Gestational diabetes mellitus in pregnancy, insulin controlled: Secondary | ICD-10-CM

## 2018-01-04 DIAGNOSIS — O3430 Maternal care for cervical incompetence, unspecified trimester: Secondary | ICD-10-CM

## 2018-01-04 DIAGNOSIS — O0993 Supervision of high risk pregnancy, unspecified, third trimester: Secondary | ICD-10-CM

## 2018-01-04 DIAGNOSIS — O3432 Maternal care for cervical incompetence, second trimester: Secondary | ICD-10-CM

## 2018-01-04 DIAGNOSIS — Z8751 Personal history of pre-term labor: Secondary | ICD-10-CM

## 2018-01-04 DIAGNOSIS — Z3A24 24 weeks gestation of pregnancy: Secondary | ICD-10-CM

## 2018-01-04 DIAGNOSIS — O99212 Obesity complicating pregnancy, second trimester: Secondary | ICD-10-CM

## 2018-01-04 LAB — POCT URINALYSIS DIPSTICK OB: Glucose, UA: NEGATIVE

## 2018-01-04 NOTE — Progress Notes (Signed)
Routine Prenatal Care Visit  Subjective  Lindsay Wagner is a 27 y.o. [phone] at [redacted]w[redacted]d being seen today for ongoing prenatal care.  She is currently monitored for the following issues for this high-risk pregnancy and has BMI 40.0-44.9, adult (HCC); Morbid obesity (HCC); History of cervical incompetence; Supervision of high risk pregnancy, antepartum, third trimester; History of preterm delivery; Hyperemesis affecting pregnancy, antepartum; Gestational diabetes; and Cervical cerclage suture present on their problem list.  ----------------------------------------------------------------------------------- Patient reports no complaints.   Contractions: Not present. Vag. Bleeding: None.  Movement: Present. Denies leaking of fluid.  ----------------------------------------------------------------------------------- The following portions of the patient's history were reviewed and updated as appropriate: allergies, current medications, past family history, past medical history, past social history, past surgical history and problem list. Problem list updated.   Objective  Blood pressure 100/66, weight 229 lb (103.9 kg), last menstrual period 07/25/2017. Pregravid weight 245 lb (111.1 kg) Total Weight Gain -16 lb (-7.258 kg) Urinalysis:      Fetal Status: Fetal Heart Rate (bpm): 150 Fundal Height: 25 cm Movement: Present     General:  Alert, oriented and cooperative. Patient is in no acute distress.  Skin: Skin is warm and dry. No rash noted.   Cardiovascular: Normal heart rate noted  Respiratory: Normal respiratory effort, no problems with respiration noted  Abdomen: Soft, gravid, appropriate for gestational age. Pain/Pressure: Absent     Pelvic:  Cervical exam deferred        Extremities: Normal range of motion.     ental Status: Normal mood and affect. Normal behavior. Normal judgment and thought content.   Date Fasting Breakfast Lunch Dinner  10/7 73 104 136 121  10/8 84 124 118 75    10/9 82 140 120 98  10/10 76 106 132 116  10/11 80 120  126  10/12 72 118 114 86  10/13 76 136 114 119  10/14 85 112 124 138                Assessment   27 y.o. [id number] at [redacted]w[redacted]d by  04/26/2018, by Ultrasound presenting for routine prenatal visit  Plan   Pregnancy #3 Problems (from 09/06/17 to present)    Problem Noted Resolved   Cervical cerclage suture present 12/07/2017 by Natale Milch, MD No   Gestational diabetes 10/25/2017 by Conard Novak, MD No   History of preterm delivery 10/11/2017 by Natale Milch, MD No   Hyperemesis affecting pregnancy, antepartum 10/11/2017 by Natale Milch, MD No   Supervision of high risk pregnancy, antepartum, third trimester 09/06/2017 by Natale Milch, MD No   Overview Addendum 12/07/2017 12:43 PM by Natale Milch, MD      [facility]  Dating 7wk6d ultrasound Blood type: A/Positive/-- (06/18 1039)   Genetic Screen 1 Screen:    Negative Antibody:Negative (06/18 1039)  Anatomic [place] incomplete Rubella: 1.81 (06/18 1039) Varicella: Immune  GTT Early: 1hr and 3 hr elevated       28 wk:      RPR: Non Reactive (06/18 1039)   Rhogam  not indicated HBsAg: Negative (06/18 1039)   TDaP vaccine                       HIV: Non Reactive (06/18 1039)   Flu Shot 12/07/17 GBS:   Contraception Tubal [ ]  sign consent Pap: 2017 NIL  CBB     CS/VBAC Not applicable   Baby Food Breast,  tried before for 2 days but had difficulty with latching   Support Person   Husband [name] at 12 weeks       Morbid obesity (HCC) 07/04/2017 by Natale Milch, MD No   Overview Signed 10/11/2017 10:50 AM by Natale Milch, MD    BMI >=40 [ X] early 1h gtt -  [ ]  u/s for dating [ ]   [ ]  nutritional goals [ ]  folic acid 1mg  [ ]  bASA (>12 weeks) [ ]  consider nutrition consult [ ]  consider maternal EKG 1st trimester [ ]  Growth u/s 28 [ ] , 32 [ ] , 36 weeks [ ]  [ ]  NST/AFI weekly 36+ weeks  (36[] , 37[] , 38[] , 39[] , 40[] ) [ ]  IOL by 41 weeks (scheduled, prn [] ) [ ]  anesthesia consult      History of cervical incompetence 07/04/2017 by Natale Milch, MD No   Overview Signed 10/11/2017 10:46 AM by Natale Milch, MD    [ ]  Cerclage at [redacted] weeks gestation          Gestational age appropriate obstetric precautions including but not limited to vaginal bleeding, contractions, leaking of fluid and fetal movement were reviewed in detail with the patient.   - All fasting BG reading in range.  Meal BG values with intermittent borderline highs although no clear pattern. - Follow up 2 weeks - Discussed likely need to add an antihyperglycemic agent at some point - Growth at 28 weeks  Return in about 2 weeks (around 01/18/2018) for 2 weeks ROB.  Vena Austria, MD, Evern Core [facility], [facility] 01/04/2018, 12:23 PM

## 2018-01-04 NOTE — Progress Notes (Signed)
ROB Swollen gums/Dentist Rx given

## 2018-01-18 ENCOUNTER — Ambulatory Visit (INDEPENDENT_AMBULATORY_CARE_PROVIDER_SITE_OTHER): Payer: 59 | Admitting: Certified Nurse Midwife

## 2018-01-18 VITALS — BP 100/60 | Wt 224.0 lb

## 2018-01-18 DIAGNOSIS — O24410 Gestational diabetes mellitus in pregnancy, diet controlled: Secondary | ICD-10-CM

## 2018-01-18 DIAGNOSIS — O0993 Supervision of high risk pregnancy, unspecified, third trimester: Secondary | ICD-10-CM

## 2018-01-18 DIAGNOSIS — O3432 Maternal care for cervical incompetence, second trimester: Secondary | ICD-10-CM

## 2018-01-18 DIAGNOSIS — O3430 Maternal care for cervical incompetence, unspecified trimester: Secondary | ICD-10-CM

## 2018-01-18 DIAGNOSIS — Z3A26 26 weeks gestation of pregnancy: Secondary | ICD-10-CM

## 2018-01-18 DIAGNOSIS — Z8742 Personal history of other diseases of the female genital tract: Secondary | ICD-10-CM

## 2018-01-18 DIAGNOSIS — Z8759 Personal history of other complications of pregnancy, childbirth and the puerperium: Secondary | ICD-10-CM

## 2018-01-18 DIAGNOSIS — O368390 Maternal care for abnormalities of the fetal heart rate or rhythm, unspecified trimester, not applicable or unspecified: Secondary | ICD-10-CM

## 2018-01-18 LAB — POCT URINALYSIS DIPSTICK OB: GLUCOSE, UA: NEGATIVE

## 2018-01-18 NOTE — Progress Notes (Signed)
No [email or web address].rj 

## 2018-01-20 NOTE — Progress Notes (Signed)
HROB at 26 weeks: Early GDM vs pregestational diabetes. Has cerclage. Good FM. Never went to [facility] for instruction in diet.  Now has medicaid and should be covered. Desires to sign 30 day papers for BTL Fasting blood sugars are normal, but not quite half of 2 hr pp elevated. Range 94-138. EXAM: FH 27 cm. Irregular irregularity to FHTs. Not tachycardic. A/P: GDM- needs instruction in diet. Will consult [facility] consult for FHR irregularity and GDM vs pregestational diabetes. 30 day Medicaid papers signed after discussion on permanence of procedure, failure rate, risks of the procedure including bleeding, infection, injury to other pelvic structures/ organs and the risk of anesthesia.  ROB in 2 weeks. Needs growth scan but will probably be done by DP. Wants to breast feed. Farrel Conners, CNM

## 2018-01-30 ENCOUNTER — Ambulatory Visit
Admission: RE | Admit: 2018-01-30 | Discharge: 2018-01-30 | Disposition: A | Discharge status: Home / Self Care | Payer: 59 | Source: Ambulatory Visit | Attending: Maternal & Fetal Medicine | Admitting: Maternal & Fetal Medicine

## 2018-01-30 ENCOUNTER — Other Ambulatory Visit: Payer: Self-pay | Admitting: Maternal & Fetal Medicine

## 2018-01-30 DIAGNOSIS — O368390 Maternal care for abnormalities of the fetal heart rate or rhythm, unspecified trimester, not applicable or unspecified: Secondary | ICD-10-CM | POA: Diagnosis present

## 2018-01-30 DIAGNOSIS — O24912 Unspecified diabetes mellitus in pregnancy, second trimester: Secondary | ICD-10-CM | POA: Insufficient documentation

## 2018-01-30 DIAGNOSIS — O368320 Maternal care for abnormalities of the fetal heart rate or rhythm, second trimester, not applicable or unspecified: Secondary | ICD-10-CM | POA: Insufficient documentation

## 2018-01-30 DIAGNOSIS — Z8759 Personal history of other complications of pregnancy, childbirth and the puerperium: Secondary | ICD-10-CM

## 2018-01-30 DIAGNOSIS — Z8751 Personal history of pre-term labor: Secondary | ICD-10-CM

## 2018-01-30 DIAGNOSIS — Z8742 Personal history of other diseases of the female genital tract: Secondary | ICD-10-CM

## 2018-01-30 DIAGNOSIS — O3430 Maternal care for cervical incompetence, unspecified trimester: Secondary | ICD-10-CM

## 2018-01-30 DIAGNOSIS — O0993 Supervision of high risk pregnancy, unspecified, third trimester: Secondary | ICD-10-CM

## 2018-01-30 DIAGNOSIS — Z3A27 27 weeks gestation of pregnancy: Secondary | ICD-10-CM | POA: Diagnosis not present

## 2018-01-30 DIAGNOSIS — O24419 Gestational diabetes mellitus in pregnancy, unspecified control: Secondary | ICD-10-CM

## 2018-01-30 NOTE — Progress Notes (Signed)
[facility] Consultation   Chief Complaint:  FHR irregularity and GDM vs pregestational diabetes  HPI: [name] is a 27 y.o. [id number] at [redacted]w[redacted]d by [redacted]w[redacted]d [place] who presents in consultation from  [facility] for FHR irregularity and GDM vs pregestational diabetes.    A fetal heart rate irregularity was detected at her last PN visit on 01/18/2018.    She had an abnormal GTT at [redacted] weeks gestation.  Since then, she has been monitoring her BSs.  She brought her log with her, but it does not include blood sugars from the last week.  "They are at home."  She reports that her FBSs are in the 70's, and her 2 hr PP BSs are in the 110s to 120s.  Of the 3 FBSs she recorded since her last visit, all are < 90 ( 74-85).  Of the 6 PPs she recorded, 3/6 were > 120 (97-134).  She has not received a call from [facility] yet about an appointment.    Her pregnancy is also complicated by a history of cervical incompetence and is S/P McDonald cerclage placement at [redacted] weeks gestation.    Obstetric History:  OB History  Gravida Para Term Preterm AB Living  3 1 0 1 1 1   SAB TAB Ectopic Multiple Live Births  1 0 0 0 1    # Outcome Date GA Lbr Len/2nd Weight Sex Delivery Anes PTL Lv  3 Current           2 Preterm 04/14/11 [redacted]w[redacted]d  2551 g F Vag-Spont   LIV     Birth Comments: cerclage  1 SAB 05/22/10 [redacted]w[redacted]d   F    FD     Birth Comments: preterm labor    Gynecologic History:  Patient's last menstrual period was 07/25/2017 (approximate).  The patient reports that her last Pap smear was 07/04/2017 when she had her Nexplanon removed.  I see a GC and chlamydia result from that date, but no Pap report.    Past Medical History: Patient  has a past medical history of Bronchitis, Cervical cerclage suture present in second trimester (12/07/2017), GERD (gastroesophageal reflux disease), and History of preterm delivery.   Past Surgical History: She  has a past surgical history that includes Cervical  cerclage (11/12/2010) and Cervical cerclage (N/A, 10/13/2017).   Medications: PN vitamins  Allergies: Patient has No Known Allergies.   Social History: Patient  reports that she has never smoked. She has never used smokeless tobacco. She reports that she does not drink alcohol or use drugs.   The patient lives with her husband and daughter.  She works at [employer].  Family History: Noncontributory.  Specifically, there is no family history of diabetes or heart disease in a first degree relative.   Review of Systems A full 12 point review of systems was negative.  Physical Exam: T 97.9, HR 105, R 18, BP 100/60  [place] today - cephalic, EFW 1112 g (39%), AFI = 13.3 cm.  No fetal arrhythmia.  A full [place] report will follow.  Asessement:[id number] at [redacted]w[redacted]d with: 1. History of irregular fetal heart beat normal fetal heart rate today 2. Gestational vs type 2 diabetes with postprandial hyperglycemia 3. Morbid obesity 4. History of incompetent cx with McDonald cerclage in situ  Recommendations: 1. History of irregular fetal heart beat normal fetal heart rate today  The patient was counseled that the most likely cause of an irregular fetal heart rate is premature atrial contractions,  which, in the absence of a structural fetal cardiac defect are benign and resolve before birth.   We will check her again in 4 weeks.   2. Gestational vs type 2 diabetes with postprandial hyperglycemia  We placed a call to [facility].  She is scheduled there on 02/08/2018 at 9AM.  In the interim, she was advised to avoid concentrated sweets and foods with a high glycemic index.    Fetal surveillance to include USs for fetal growth every 4 weeks.  We scheduled her next one here.  She was scheduled to return in 4 weeks for [place] to review her blood sugars.  Unless more than 2/3rds of her FBSs and more than 2/3rds of her her 2 hr PPs are in target range, she should have weekly testing starting at [redacted] weeks gestation, twice  weekly testing starting at [redacted] weeks gestation to include weekly assessment of amniotic fluid volume. 3. Morbid obesity   Fetal surveillance as above 4. History of incompetent cx with McDonald cerclage in situ  Cerclage removal at 36 weeks   Total time spent with the patient was 40 minutes with greater than 50% spent in counseling and coordination of care.  We appreciate this consult and will be happy to be involved in the ongoing care of Ms. Schroepfer in anyway her obstetricians desire.  Argentina Ponder, MD [facility]

## 2018-02-01 ENCOUNTER — Encounter: Payer: Self-pay | Admitting: Advanced Practice Midwife

## 2018-02-01 ENCOUNTER — Ambulatory Visit (INDEPENDENT_AMBULATORY_CARE_PROVIDER_SITE_OTHER): Payer: 59 | Admitting: Advanced Practice Midwife

## 2018-02-01 VITALS — BP 118/74 | Wt 227.0 lb

## 2018-02-01 DIAGNOSIS — Z3A28 28 weeks gestation of pregnancy: Secondary | ICD-10-CM

## 2018-02-01 LAB — POCT URINALYSIS DIPSTICK OB: Glucose, UA: NEGATIVE

## 2018-02-01 NOTE — Progress Notes (Signed)
Routine Prenatal Care Visit  Subjective  Lindsay Wagner is a 27 y.o. [phone] at [redacted]w[redacted]d being seen today for ongoing prenatal care.  She is currently monitored for the following issues for this high-risk pregnancy and has BMI 40.0-44.9, adult (HCC); Morbid obesity (HCC); History of cervical incompetence; Supervision of high risk pregnancy, antepartum, third trimester; History of preterm delivery; Hyperemesis affecting pregnancy, antepartum; Gestational diabetes; and Cervical cerclage suture present on their problem list.  ----------------------------------------------------------------------------------- Patient reports backache.  Reviewed blood sugar log. All fasting numbers are < 90. All after meal numbers are less than 120 with 1 exception. She was seen by DP 2 days ago and no fetal arrhythmia seen. She now has an appointment with [facility] on 11/20. She will have Q 4wk growth scans with MFM. APT NST/AFI weekly starting at 32 weeks and bi-weekly at 34 weeks per DP.  Contractions: Not present. Vag. Bleeding: None.  Movement: Present. Denies leaking of fluid.  ----------------------------------------------------------------------------------- The following portions of the patient's history were reviewed and updated as appropriate: allergies, current medications, past family history, past medical history, past social history, past surgical history and problem list. Problem list updated.   Objective  Blood pressure 118/74, weight 227 lb (103 kg), last menstrual period 07/25/2017. Pregravid weight 245 lb (111.1 kg) Total Weight Gain -18 lb (-8.165 kg) Urinalysis: Urine Protein    Urine Glucose    Fetal Status: Fetal Heart Rate (bpm): 134 Fundal Height: 29 cm Movement: Present     General:  Alert, oriented and cooperative. Patient is in no acute distress.  Skin: Skin is warm and dry. No rash noted.   Cardiovascular: Normal heart rate noted  Respiratory: Normal respiratory effort, no problems  with respiration noted  Abdomen: Soft, gravid, appropriate for gestational age. Pain/Pressure: Absent     Pelvic:  Cervical exam deferred        Extremities: Normal range of motion.  Edema: None  Mental Status: Normal mood and affect. Normal behavior. Normal judgment and thought content.   Assessment   27 y.o. [id number] at [redacted]w[redacted]d by  04/26/2018, by Ultrasound presenting for routine prenatal visit  Plan   Pregnancy #3 Problems (from 09/06/17 to present)    Problem Noted Resolved   Cervical cerclage suture present 12/07/2017 by Natale Milch, MD No   Gestational diabetes 10/25/2017 by Conard Novak, MD No   History of preterm delivery 10/11/2017 by Natale Milch, MD No   Hyperemesis affecting pregnancy, antepartum 10/11/2017 by Natale Milch, MD No   Supervision of high risk pregnancy, antepartum, third trimester 09/06/2017 by Natale Milch, MD No   Overview Addendum 01/21/2018  8:14 AM by Farrel Conners, CNM      [facility] Prenatal Labs  Dating 7wk6d ultrasound Blood type: A/Positive/-- (06/18 1039)   Genetic Screen 1 Screen:    Negative Antibody:Negative (06/18 1039)  Anatomic [place] Complete/ Baby Journey Rubella: 1.81 (06/18 1039) Varicella: Immune  GTT Early: 1hr and 3 hr elevated       28 wk:      RPR: Non Reactive (06/18 1039)   Rhogam  not indicated HBsAg: Negative (06/18 1039)   TDaP vaccine                       HIV: Non Reactive (06/18 1039)   Flu Shot 12/07/17 GBS:   Contraception Tubal [x ] sign consent Pap: 2017 NIL  CBB     CS/VBAC Not applicable   Baby Food  Breast, tried before for 2 days but had difficulty with latching   Support Person   Husband Rachell CiproJonathan     CERCLAGE at 12 weeks       Morbid obesity (HCC) 07/04/2017 by Natale MilchSchuman, Christanna R, MD No   Overview Signed 10/11/2017 10:50 AM by Natale MilchSchuman, Christanna R, MD    BMI >=40 [ X] early 1h gtt -  [ ]  u/s for dating [ ]   [ ]  nutritional goals [ ]  folic acid 1mg  [ ]  bASA (>12  weeks) [ ]  consider nutrition consult [ ]  consider maternal EKG 1st trimester [ ]  Growth u/s 28 [ ] , 32 [ ] , 36 weeks [ ]  [ ]  NST/AFI weekly 36+ weeks (36[] , 37[] , 38[] , 39[] , 40[] ) [ ]  IOL by 41 weeks (scheduled, prn [] ) [ ]  anesthesia consult      History of cervical incompetence 07/04/2017 by Natale MilchSchuman, Christanna R, MD No   Overview Signed 10/11/2017 10:46 AM by Natale MilchSchuman, Christanna R, MD    [ ]  Cerclage at [redacted] weeks gestation          Preterm labor symptoms and general obstetric precautions including but not limited to vaginal bleeding, contractions, leaking of fluid and fetal movement were reviewed in detail with the patient. Please refer to After Visit Summary for other counseling recommendations.   Return in about 2 weeks (around 02/15/2018) for rob.  Tresea MallJane Dorean Hiebert, CNM 02/01/2018 11:15 AM

## 2018-02-01 NOTE — Progress Notes (Signed)
No vb. No lof.  

## 2018-02-01 NOTE — Patient Instructions (Signed)
Third Trimester of Pregnancy The third trimester is from week 28 through week 40 (months 7 through 9). The third trimester is a time when the unborn baby (fetus) is growing rapidly. At the end of the ninth month, the fetus is about 20 inches in length and weighs 6-10 pounds. Body changes during your third trimester Your body will continue to go through many changes during pregnancy. The changes vary from woman to woman. During the third trimester:  Your weight will continue to increase. You can expect to gain 25-35 pounds (11-16 kg) by the end of the pregnancy.  You may begin to get stretch marks on your hips, abdomen, and breasts.  You may urinate more often because the fetus is moving lower into your pelvis and pressing on your bladder.  You may develop or continue to have heartburn. This is caused by increased hormones that slow down muscles in the digestive tract.  You may develop or continue to have constipation because increased hormones slow digestion and cause the muscles that push waste through your intestines to relax.  You may develop hemorrhoids. These are swollen veins (varicose veins) in the rectum that can itch or be painful.  You may develop swollen, bulging veins (varicose veins) in your legs.  You may have increased body aches in the pelvis, back, or thighs. This is due to weight gain and increased hormones that are relaxing your joints.  You may have changes in your hair. These can include thickening of your hair, rapid growth, and changes in texture. Some women also have hair loss during or after pregnancy, or hair that feels dry or thin. Your hair will most likely return to normal after your baby is born.  Your breasts will continue to grow and they will continue to become tender. A yellow fluid (colostrum) may leak from your breasts. This is the first milk you are producing for your baby.  Your belly button may stick out.  You may notice more swelling in your hands,  face, or ankles.  You may have increased tingling or numbness in your hands, arms, and legs. The skin on your belly may also feel numb.  You may feel short of breath because of your expanding uterus.  You may have more problems sleeping. This can be caused by the size of your belly, increased need to urinate, and an increase in your body's metabolism.  You may notice the fetus "dropping," or moving lower in your abdomen (lightening).  You may have increased vaginal discharge.  You may notice your joints feel loose and you may have pain around your pelvic bone.  What to expect at prenatal visits You will have prenatal exams every 2 weeks until week 36. Then you will have weekly prenatal exams. During a routine prenatal visit:  You will be weighed to make sure you and the baby are growing normally.  Your blood pressure will be taken.  Your abdomen will be measured to track your baby's growth.  The fetal heartbeat will be listened to.  Any test results from the previous visit will be discussed.  You may have a cervical check near your due date to see if your cervix has softened or thinned (effaced).  You will be tested for Group B streptococcus. This happens between 35 and 37 weeks.  Your health care provider may ask you:  What your birth plan is.  How you are feeling.  If you are feeling the baby move.  If you have had   any abnormal symptoms, such as leaking fluid, bleeding, severe headaches, or abdominal cramping.  If you are using any tobacco products, including cigarettes, chewing tobacco, and electronic cigarettes.  If you have any questions.  Other tests or screenings that may be performed during your third trimester include:  Blood tests that check for low iron levels (anemia).  Fetal testing to check the health, activity level, and growth of the fetus. Testing is done if you have certain medical conditions or if there are problems during the  pregnancy.  Nonstress test (NST). This test checks the health of your baby to make sure there are no signs of problems, such as the baby not getting enough oxygen. During this test, a belt is placed around your belly. The baby is made to move, and its heart rate is monitored during movement.  What is false labor? False labor is a condition in which you feel small, irregular tightenings of the muscles in the womb (contractions) that usually go away with rest, changing position, or drinking water. These are called Braxton Hicks contractions. Contractions may last for hours, days, or even weeks before true labor sets in. If contractions come at regular intervals, become more frequent, increase in intensity, or become painful, you should see your health care provider. What are the signs of labor?  Abdominal cramps.  Regular contractions that start at 10 minutes apart and become stronger and more frequent with time.  Contractions that start on the top of the uterus and spread down to the lower abdomen and back.  Increased pelvic pressure and dull back pain.  A watery or bloody mucus discharge that comes from the vagina.  Leaking of amniotic fluid. This is also known as your "water breaking." It could be a slow trickle or a gush. Let your health care provider know if it has a color or strange odor. If you have any of these signs, call your health care provider right away, even if it is before your due date. Follow these instructions at home: Medicines  Follow your health care provider's instructions regarding medicine use. Specific medicines may be either safe or unsafe to take during pregnancy.  Take a prenatal vitamin that contains at least 600 micrograms (mcg) of folic acid.  If you develop constipation, try taking a stool softener if your health care provider approves. Eating and drinking  Eat a balanced diet that includes fresh fruits and vegetables, whole grains, good sources of protein  such as meat, eggs, or tofu, and low-fat dairy. Your health care provider will help you determine the amount of weight gain that is right for you.  Avoid raw meat and uncooked cheese. These carry germs that can cause birth defects in the baby.  If you have low calcium intake from food, talk to your health care provider about whether you should take a daily calcium supplement.  Eat four or five small meals rather than three large meals a day.  Limit foods that are high in fat and processed sugars, such as fried and sweet foods.  To prevent constipation: ? Drink enough fluid to keep your urine clear or pale yellow. ? Eat foods that are high in fiber, such as fresh fruits and vegetables, whole grains, and beans. Activity  Exercise only as directed by your health care provider. Most women can continue their usual exercise routine during pregnancy. Try to exercise for 30 minutes at least 5 days a week. Stop exercising if you experience uterine contractions.  Avoid heavy   lifting.  Do not exercise in extreme heat or humidity, or at high altitudes.  Wear low-heel, comfortable shoes.  Practice good posture.  You may continue to have sex unless your health care provider tells you otherwise. Relieving pain and discomfort  Take frequent breaks and rest with your legs elevated if you have leg cramps or low back pain.  Take warm sitz baths to soothe any pain or discomfort caused by hemorrhoids. Use hemorrhoid cream if your health care provider approves.  Wear a good support bra to prevent discomfort from breast tenderness.  If you develop varicose veins: ? Wear support pantyhose or compression stockings as told by your healthcare provider. ? Elevate your feet for 15 minutes, 3-4 times a day. Prenatal care  Write down your questions. Take them to your prenatal visits.  Keep all your prenatal visits as told by your health care provider. This is important. Safety  Wear your seat belt at  all times when driving.  Make a list of emergency phone numbers, including numbers for family, friends, the hospital, and police and fire departments. General instructions  Avoid cat litter boxes and soil used by cats. These carry germs that can cause birth defects in the baby. If you have a cat, ask someone to clean the litter box for you.  Do not travel far distances unless it is absolutely necessary and only with the approval of your health care provider.  Do not use hot tubs, steam rooms, or saunas.  Do not drink alcohol.  Do not use any products that contain nicotine or tobacco, such as cigarettes and e-cigarettes. If you need help quitting, ask your health care provider.  Do not use any medicinal herbs or unprescribed drugs. These chemicals affect the formation and growth of the baby.  Do not douche or use tampons or scented sanitary pads.  Do not cross your legs for long periods of time.  To prepare for the arrival of your baby: ? Take prenatal classes to understand, practice, and ask questions about labor and delivery. ? Make a trial run to the hospital. ? Visit the hospital and tour the maternity area. ? Arrange for maternity or paternity leave through employers. ? Arrange for family and friends to take care of pets while you are in the hospital. ? Purchase a rear-facing car seat and make sure you know how to install it in your car. ? Pack your hospital bag. ? Prepare the baby's nursery. Make sure to remove all pillows and stuffed animals from the baby's crib to prevent suffocation.  Visit your dentist if you have not gone during your pregnancy. Use a soft toothbrush to brush your teeth and be gentle when you floss. Contact a health care provider if:  You are unsure if you are in labor or if your water has broken.  You become dizzy.  You have mild pelvic cramps, pelvic pressure, or nagging pain in your abdominal area.  You have lower back pain.  You have persistent  nausea, vomiting, or diarrhea.  You have an unusual or bad smelling vaginal discharge.  You have pain when you urinate. Get help right away if:  Your water breaks before 37 weeks.  You have regular contractions less than 5 minutes apart before 37 weeks.  You have a fever.  You are leaking fluid from your vagina.  You have spotting or bleeding from your vagina.  You have severe abdominal pain or cramping.  You have rapid weight loss or weight gain.    You have shortness of breath with chest pain.  You notice sudden or extreme swelling of your face, hands, ankles, feet, or legs.  Your baby makes fewer than 10 movements in 2 hours.  You have severe headaches that do not go away when you take medicine.  You have vision changes. Summary  The third trimester is from week 28 through week 40, months 7 through 9. The third trimester is a time when the unborn baby (fetus) is growing rapidly.  During the third trimester, your discomfort may increase as you and your baby continue to gain weight. You may have abdominal, leg, and back pain, sleeping problems, and an increased need to urinate.  During the third trimester your breasts will keep growing and they will continue to become tender. A yellow fluid (colostrum) may leak from your breasts. This is the first milk you are producing for your baby.  False labor is a condition in which you feel small, irregular tightenings of the muscles in the womb (contractions) that eventually go away. These are called Braxton Hicks contractions. Contractions may last for hours, days, or even weeks before true labor sets in.  Signs of labor can include: abdominal cramps; regular contractions that start at 10 minutes apart and become stronger and more frequent with time; watery or bloody mucus discharge that comes from the vagina; increased pelvic pressure and dull back pain; and leaking of amniotic fluid. This information is not intended to replace advice  given to you by your health care provider. Make sure you discuss any questions you have with your health care provider. Document Released: 03/02/2001 Document Revised: 08/14/2015 Document Reviewed: 05/09/2012 Elsevier Interactive Patient Education  2017 [employer]  

## 2018-02-08 ENCOUNTER — Encounter: Payer: Self-pay | Admitting: *Deleted

## 2018-02-08 ENCOUNTER — Encounter: Payer: 59 | Attending: Certified Nurse Midwife | Admitting: *Deleted

## 2018-02-08 VITALS — BP 100/60 | Ht 64.0 in | Wt 227.7 lb

## 2018-02-08 DIAGNOSIS — Z713 Dietary counseling and surveillance: Secondary | ICD-10-CM | POA: Diagnosis present

## 2018-02-08 DIAGNOSIS — Z6839 Body mass index (BMI) 39.0-39.9, adult: Secondary | ICD-10-CM | POA: Diagnosis not present

## 2018-02-08 DIAGNOSIS — O24410 Gestational diabetes mellitus in pregnancy, diet controlled: Secondary | ICD-10-CM | POA: Diagnosis not present

## 2018-02-08 NOTE — Patient Instructions (Signed)
Read booklet on Gestational Diabetes Follow Gestational Meal Planning Guidelines Avoid fruit juices Avoid cold cereal for breakfast Include 1 serving of protein for breakfast Complete a 3 Day Food Record and bring to next appointment Check blood sugars 4 x day - before breakfast and 2 hrs after every meal and record  Bring blood sugar log to all appointments Purchase urine ketone strips if blood sugars not controlled and check urine ketones every am:  If + increase bedtime snack to 1 protein and 2 carbohydrate servings Walk 20-30 minutes at least 5 x week if permitted by MD

## 2018-02-08 NOTE — Progress Notes (Signed)
Diabetes Self-Management Education  Visit Type: First/Initial  Appt. Start Time: 0910 Appt. End Time: 1035  02/08/2018  [name], identified by name and date of birth, is a 27 y.o. female with a diagnosis of Diabetes: Gestational Diabetes.   ASSESSMENT  Blood pressure 100/60, height 5\' 4"  (1.626 m), weight 227 lb 11.2 oz (103.3 kg), last menstrual period 07/25/2017. Body mass index is 39.08 kg/m.  Diabetes Self-Management Education - 02/08/18 1018      Visit Information   Visit Type  First/Initial      Initial Visit   Diabetes Type  Gestational Diabetes    Are you currently following a meal plan?  Yes    What type of meal plan do you follow?  "change in foods"    Are you taking your medications as prescribed?  Yes    Date Diagnosed  Abnormal GTT 10/11/17      Health Coping   How would you rate your overall health?  Good      Psychosocial Assessment   Patient Belief/Attitude about Diabetes  Other (comment)   Pt reports feeling "nothing different"   Self-care barriers  None    Self-management support  Doctor's office    Patient Concerns  Nutrition/Meal planning;Problem Solving    Special Needs  None    Preferred Learning Style  Hands on    Learning Readiness  Ready    How often do you need to have someone help you when you read instructions, pamphlets, or other written materials from your doctor or pharmacy?  1 - Never    What is the last grade level you completed in school?  high school      Pre-Education Assessment   Patient understands the diabetes disease and treatment process.  Needs Instruction    Patient understands incorporating nutritional management into lifestyle.  Needs Instruction    Patient undertands incorporating physical activity into lifestyle.  Needs Instruction    Patient understands using medications safely.  Needs Instruction    Patient understands monitoring blood glucose, interpreting and using results  Needs Review    Patient understands  prevention, detection, and treatment of acute complications.  Needs Instruction    Patient understands prevention, detection, and treatment of chronic complications.  Needs Instruction    Patient understands how to develop strategies to address psychosocial issues.  Needs Instruction    Patient understands how to develop strategies to promote health/change behavior.  Needs Instruction      Complications   How often do you check your blood sugar?  > 4 times/day   Pt already has a meter and checks her blood sugars 4 x day.    Fasting Blood glucose range (mg/dL)  16-109   FBG's range from 73-92 mg/dL.    Postprandial Blood glucose range (mg/dL)  60-454;098-119   pp's breakfast 94-133 (4/23 elevated); lunch 96-128 mg/dL (1/47 elevated); supper 94-134 mg/dL (8/29 elevated)   Have you had a dilated eye exam in the past 12 months?  No    Have you had a dental exam in the past 12 months?  Yes    Are you checking your feet?  No      Dietary Intake   Breakfast  cereal and milk or eggs and bacon    Lunch  meat sandwich and chips    Dinner  chicken or beef; peas, corn, beans, green beans, lettuce, cuccumbers, greens - rarely potatoes, rice or pasta    Beverage(s)  water, juice  Exercise   Exercise Type  ADL's      Patient Education   Previous Diabetes Education  No    Disease state   Definition of diabetes, type 1 and 2, and the diagnosis of diabetes;Factors that contribute to the development of diabetes    Nutrition management   Role of diet in the treatment of diabetes and the relationship between the three main macronutrients and blood glucose level;Reviewed blood glucose goals for pre and post meals and how to evaluate the patients' food intake on their blood glucose level.    Physical activity and exercise   Role of exercise on diabetes management, blood pressure control and cardiac health.    Monitoring  Taught/discussed recording of test results and interpretation of SMBG.;Ketone  testing, when, how.    Chronic complications  Relationship between chronic complications and blood glucose control    Psychosocial adjustment  Identified and addressed patients feelings and concerns about diabetes    Preconception care  Pregnancy and GDM  Role of pre-pregnancy blood glucose control on the development of the fetus;Role of family planning for patients with diabetes;Reviewed with patient blood glucose goals with pregnancy      Individualized Goals (developed by patient)   Reducing Risk  Prevent diabetes complications     Outcomes   Expected Outcomes  Demonstrated interest in learning. Expect positive outcomes       Individualized Plan for Diabetes Self-Management Training:   Learning Objective:  Patient will have a greater understanding of diabetes self-management. Patient education plan is to attend individual and/or group sessions per assessed needs and concerns.   Plan:   Patient Instructions  Read booklet on Gestational Diabetes Follow Gestational Meal Planning Guidelines Avoid fruit juices Avoid cold cereal for breakfast Include 1 serving of protein for breakfast Complete a 3 Day Food Record and bring to next appointment Check blood sugars 4 x day - before breakfast and 2 hrs after every meal and record  Bring blood sugar log to all appointments Purchase urine ketone strips if blood sugars not controlled and check urine ketones every am:  If + increase bedtime snack to 1 protein and 2 carbohydrate servings Walk 20-30 minutes at least 5 x week if permitted by MD  Expected Outcomes:  Demonstrated interest in learning. Expect positive outcomes  Education material provided:  Gestational Booklet Gestational Meal Planning Guidelines Simple Meal Plan Viewed Gestational Diabetes Video 3 Day Food Record Goals for a Healthy Pregnancy  If problems or questions, patient to contact team via:   Sharion SettlerSheila Jonhatan Hearty, RN, CCM, CDE [phone]  Future DSME appointment:   February 14, 2018 with the dietitian

## 2018-02-14 ENCOUNTER — Ambulatory Visit: Payer: 59 | Admitting: Dietician

## 2018-02-15 ENCOUNTER — Ambulatory Visit (INDEPENDENT_AMBULATORY_CARE_PROVIDER_SITE_OTHER): Payer: 59 | Admitting: Advanced Practice Midwife

## 2018-02-15 ENCOUNTER — Encounter: Payer: Self-pay | Admitting: Advanced Practice Midwife

## 2018-02-15 VITALS — BP 110/58 | Wt 230.0 lb

## 2018-02-15 DIAGNOSIS — O09213 Supervision of pregnancy with history of pre-term labor, third trimester: Secondary | ICD-10-CM

## 2018-02-15 DIAGNOSIS — Z3A30 30 weeks gestation of pregnancy: Secondary | ICD-10-CM

## 2018-02-15 DIAGNOSIS — O0993 Supervision of high risk pregnancy, unspecified, third trimester: Secondary | ICD-10-CM

## 2018-02-15 DIAGNOSIS — Z23 Encounter for immunization: Secondary | ICD-10-CM

## 2018-02-15 NOTE — Progress Notes (Signed)
Routine Prenatal Care Visit  Subjective  Lindsay Wagner is a 27 y.o. [phone] at [redacted]w[redacted]d being seen today for ongoing prenatal care.  She is currently monitored for the following issues for this high-risk pregnancy and has BMI 40.0-44.9, adult (HCC); Morbid obesity (HCC); History of cervical incompetence; Supervision of high risk pregnancy, antepartum, third trimester; History of preterm delivery; Hyperemesis affecting pregnancy, antepartum; Gestational diabetes; and Cervical cerclage suture present on their problem list.  ----------------------------------------------------------------------------------- Patient reports no complaints. Reviewed BS log and WNL. Pt was seen by [facility] and is following recommendations.  Contractions: Not present. Vag. Bleeding: None.  Movement: Present. Denies leaking of fluid.  ----------------------------------------------------------------------------------- The following portions of the patient's history were reviewed and updated as appropriate: allergies, current medications, past family history, past medical history, past social history, past surgical history and problem list. Problem list updated.   Objective  Blood pressure (!) 110/58, weight 230 lb (104.3 kg), last menstrual period 07/25/2017. Pregravid weight 245 lb (111.1 kg) Total Weight Gain -15 lb (-6.804 kg) Urinalysis: Urine Protein    Urine Glucose    Fetal Status: Fetal Heart Rate (bpm): 138 Fundal Height: 30 cm Movement: Present     General:  Alert, oriented and cooperative. Patient is in no acute distress.  Skin: Skin is warm and dry. No rash noted.   Cardiovascular: Normal heart rate noted  Respiratory: Normal respiratory effort, no problems with respiration noted  Abdomen: Soft, gravid, appropriate for gestational age. Pain/Pressure: Absent     Pelvic:  Cervical exam deferred        Extremities: Normal range of motion.  Edema: None  Mental Status: Normal mood and affect. Normal  behavior. Normal judgment and thought content.   Assessment   27 y.o. [id number] at [redacted]w[redacted]d by  04/26/2018, by Ultrasound presenting for routine prenatal visit  Plan   Pregnancy #3 Problems (from 09/06/17 to present)    Problem Noted Resolved   Cervical cerclage suture present 12/07/2017 by Natale Milch, MD No   Gestational diabetes 10/25/2017 by Conard Novak, MD No   History of preterm delivery 10/11/2017 by Natale Milch, MD No   Hyperemesis affecting pregnancy, antepartum 10/11/2017 by Natale Milch, MD No   Supervision of high risk pregnancy, antepartum, third trimester 09/06/2017 by Natale Milch, MD No   Overview Addendum 01/21/2018  8:14 AM by Farrel Conners, CNM      Clinic [facility] Prenatal Labs  Dating 7wk6d ultrasound Blood type: A/Positive/-- (06/18 1039)   Genetic Screen 1 Screen:    Negative Antibody:Negative (06/18 1039)  Anatomic [place] Complete/ Baby Journey Rubella: 1.81 (06/18 1039) Varicella: Immune  GTT Early: 1hr and 3 hr elevated       28 wk:      RPR: Non Reactive (06/18 1039)   Rhogam  not indicated HBsAg: Negative (06/18 1039)   TDaP vaccine                       HIV: Non Reactive (06/18 1039)   Flu Shot 12/07/17 GBS:   Contraception Tubal [x ] sign consent Pap: 2017 NIL  CBB     CS/VBAC Not applicable   Baby Food Breast, tried before for 2 days but had difficulty with latching   Support Person   Husband [name] at 12 weeks       Morbid obesity (HCC) 07/04/2017 by Natale Milch, MD No   Overview Signed 10/11/2017 10:50 AM by  Schuman, Christanna R, MD    BMI >=40 [ X] early 1h gtt -  [ ]  u/s for dating [ ]   [ ]  nutritional goals [ ]  folic acid 1mg  [ ]  bASA (>12 weeks) [ ]  consider nutrition consult [ ]  consider maternal EKG 1st trimester [ ]  Growth u/s 28 [ ] , 32 [ ] , 36 weeks [ ]  [ ]  NST/AFI weekly 36+ weeks (36[] , 37[] , 38[] , 39[] , 40[] ) [ ]  IOL by 41 weeks (scheduled, prn [] ) [ ]  anesthesia  consult      History of cervical incompetence 07/04/2017 by Natale MilchSchuman, Christanna R, MD No   Overview Signed 10/11/2017 10:46 AM by Natale MilchSchuman, Christanna R, MD    [ ]  Cerclage at [redacted] weeks gestation          Preterm labor symptoms and general obstetric precautions including but not limited to vaginal bleeding, contractions, leaking of fluid and fetal movement were reviewed in detail with the patient.   Return in about 2 weeks (around 03/01/2018) for afi/nst/rob.  Tresea MallJane Detravion Tester, CNM 02/15/2018 10:10 AM

## 2018-02-15 NOTE — Progress Notes (Signed)
ROB Tdap/bt concent No concerns

## 2018-02-23 ENCOUNTER — Other Ambulatory Visit: Payer: Self-pay

## 2018-02-27 ENCOUNTER — Ambulatory Visit
Admission: RE | Admit: 2018-02-27 | Discharge: 2018-02-27 | Disposition: A | Discharge status: Home / Self Care | Payer: 59 | Source: Ambulatory Visit | Attending: Obstetrics and Gynecology | Admitting: Obstetrics and Gynecology

## 2018-02-27 ENCOUNTER — Encounter: Payer: Self-pay | Admitting: Dietician

## 2018-02-27 DIAGNOSIS — Z8742 Personal history of other diseases of the female genital tract: Secondary | ICD-10-CM

## 2018-02-27 DIAGNOSIS — O24410 Gestational diabetes mellitus in pregnancy, diet controlled: Secondary | ICD-10-CM

## 2018-02-27 DIAGNOSIS — O368330 Maternal care for abnormalities of the fetal heart rate or rhythm, third trimester, not applicable or unspecified: Secondary | ICD-10-CM | POA: Insufficient documentation

## 2018-02-27 DIAGNOSIS — Z8751 Personal history of pre-term labor: Secondary | ICD-10-CM

## 2018-02-27 DIAGNOSIS — Z8759 Personal history of other complications of pregnancy, childbirth and the puerperium: Secondary | ICD-10-CM

## 2018-02-27 DIAGNOSIS — O3433 Maternal care for cervical incompetence, third trimester: Secondary | ICD-10-CM

## 2018-02-27 DIAGNOSIS — Z6841 Body Mass Index (BMI) 40.0 and over, adult: Secondary | ICD-10-CM

## 2018-02-27 DIAGNOSIS — Z3A31 31 weeks gestation of pregnancy: Secondary | ICD-10-CM | POA: Diagnosis not present

## 2018-02-27 DIAGNOSIS — O210 Mild hyperemesis gravidarum: Secondary | ICD-10-CM

## 2018-02-27 DIAGNOSIS — O368390 Maternal care for abnormalities of the fetal heart rate or rhythm, unspecified trimester, not applicable or unspecified: Secondary | ICD-10-CM | POA: Insufficient documentation

## 2018-02-27 DIAGNOSIS — O0993 Supervision of high risk pregnancy, unspecified, third trimester: Secondary | ICD-10-CM

## 2018-02-27 NOTE — Progress Notes (Signed)
See note on u/s visit

## 2018-02-27 NOTE — Progress Notes (Signed)
[facility] Consultation   Chief Complaint: Irregular FHR rhythm at last clinic visit   HPI: [name] is a 27 y.o. [id number] at 40w5dby esrly scan on who presents in consultation from [facility] noted on clinic visit . Pt feels well . No sxs of preterm labor and her RBS was 72 at entry to clinic today   Past Medical History: Patient  has a past medical history of Bronchitis, Cervical cerclage suture present in second trimester (12/07/2017), GERD (gastroesophageal reflux disease), Gestational diabetes, and History of preterm delivery.  Past Surgical History: She  has a past surgical history that includes Cervical cerclage (11/12/2010) and Cervical cerclage (N/A, 10/13/2017).  Obstetric History:  OB History    Gravida  3   Para  1   Term      Preterm  1   AB  1   Living  1     SAB  1   TAB      Ectopic      Multiple      Live Births  1          Gynecologic History:  Patient's last menstrual period was 07/25/2017 (approximate).  Medications:  Current Outpatient Medications:  .  aspirin EC 81 MG tablet, Take 1 tablet (81 mg total) by mouth daily. Take after 12 weeks for prevention of preeclampssia later in pregnancy, Disp: 300 tablet, Rfl: 2 .  Blood Glucose Monitoring Suppl (ACCU-CHEK NANO SMARTVIEW) w/Device KIT, Use as directed, Disp: 1 kit, Rfl: 0 .  glucose blood (ACCU-CHEK SMARTVIEW) test strip, Use as instructed, Disp: 100 each, Rfl: 12 .  Lancets (ACCU-CHEK SOFT TOUCH) lancets, Use as instructed, Disp: 100 each, Rfl: 12 .  Prenatal Vit-Fe Fumarate-FA (PRENATAL MULTIVITAMIN) TABS tablet, Take 2 tablets by mouth daily at 12 noon. , Disp: , Rfl:   Allergies: Patient has No Known Allergies.  Social History: Patient  reports that she has never smoked. She has never used smokeless tobacco. She reports that she does not drink alcohol or use drugs.  Family History: family history is not on file.  Review of Systems neg Physical  Exam: BP 113/76   Pulse 86   Temp 98.5 F (36.9 C)   Resp 18   Wt 105.5 kg   LMP 07/25/2017 (Approximate)   SpO2 99%   BMI 39.93 kg/m    Asessement: 1. Cervical cerclage suture present in third trimester   2. Diet controlled gestational diabetes mellitus (GDM) in third trimester   3. History of preterm delivery   4. Hyperemesis affecting pregnancy, antepartum   5. Supervision of high risk pregnancy, antepartum, third trimester   6. History of cervical incompetence   7. Morbid obesity (HEffort   8. Abnormality in fetal heart rhythm before the onset of labor     Plan: 1 FHR-Normal structural cardiac  ultrasound no abnormal FHR noted on normal gray scale scan and m mode scan , I reviewed that irregular FHR is common and usually benign. FHR abnormalities frequently are  due to Premature atrial contractions. (PACs) . I advised hydration, avoiding caffeine or other medications that might increase heart rate like OTC cold remedies. Check FHR at  Clinic visits. If problem recurs or persists consider fetal echo.  2 Gest DM A1 - RBS normal I did not review her log- [facility], [facility] at 36 weeks , [facility]  3 cerclage - cervix not assessed  given 30 weeks - consider removal at 36 weeks   Due to limited nature of consultation - no charge   Gatha Mayer, [facility]

## 2018-02-28 ENCOUNTER — Telehealth: Payer: Self-pay | Admitting: Dietician

## 2018-02-28 NOTE — Telephone Encounter (Signed)
Have not yet heard back from patient after message was left to reschedule her cancelled appointment from 02/14/18. Called patient and left another message requesting a call back.

## 2018-03-02 ENCOUNTER — Ambulatory Visit (INDEPENDENT_AMBULATORY_CARE_PROVIDER_SITE_OTHER): Payer: 59 | Admitting: Maternal Newborn

## 2018-03-02 ENCOUNTER — Encounter: Payer: Self-pay | Admitting: Maternal Newborn

## 2018-03-02 ENCOUNTER — Telehealth: Payer: Self-pay | Admitting: Dietician

## 2018-03-02 ENCOUNTER — Ambulatory Visit (INDEPENDENT_AMBULATORY_CARE_PROVIDER_SITE_OTHER): Payer: 59

## 2018-03-02 VITALS — BP 110/70 | Wt 235.0 lb

## 2018-03-02 DIAGNOSIS — O0993 Supervision of high risk pregnancy, unspecified, third trimester: Secondary | ICD-10-CM

## 2018-03-02 DIAGNOSIS — Z3689 Encounter for other specified antenatal screening: Secondary | ICD-10-CM | POA: Diagnosis not present

## 2018-03-02 DIAGNOSIS — O24410 Gestational diabetes mellitus in pregnancy, diet controlled: Secondary | ICD-10-CM

## 2018-03-02 DIAGNOSIS — Z3A32 32 weeks gestation of pregnancy: Secondary | ICD-10-CM

## 2018-03-02 LAB — POCT URINALYSIS DIPSTICK OB
Glucose, UA: NEGATIVE
PROTEIN: NEGATIVE

## 2018-03-02 NOTE — Telephone Encounter (Signed)
Called patient a second time to reschedule her missed appointment from 02/14/18. Had received indication that she returned the previous call but did not leave a message. Left a voicemail message for patient requesting a call back.

## 2018-03-02 NOTE — Patient Instructions (Signed)
Third Trimester of Pregnancy The third trimester is from week 28 through week 40 (months 7 through 9). The third trimester is a time when the unborn baby (fetus) is growing rapidly. At the end of the ninth month, the fetus is about 20 inches in length and weighs 6-10 pounds. Body changes during your third trimester Your body will continue to go through many changes during pregnancy. The changes vary from woman to woman. During the third trimester:  Your weight will continue to increase. You can expect to gain 25-35 pounds (11-16 kg) by the end of the pregnancy.  You may begin to get stretch marks on your hips, abdomen, and breasts.  You may urinate more often because the fetus is moving lower into your pelvis and pressing on your bladder.  You may develop or continue to have heartburn. This is caused by increased hormones that slow down muscles in the digestive tract.  You may develop or continue to have constipation because increased hormones slow digestion and cause the muscles that push waste through your intestines to relax.  You may develop hemorrhoids. These are swollen veins (varicose veins) in the rectum that can itch or be painful.  You may develop swollen, bulging veins (varicose veins) in your legs.  You may have increased body aches in the pelvis, back, or thighs. This is due to weight gain and increased hormones that are relaxing your joints.  You may have changes in your hair. These can include thickening of your hair, rapid growth, and changes in texture. Some women also have hair loss during or after pregnancy, or hair that feels dry or thin. Your hair will most likely return to normal after your baby is born.  Your breasts will continue to grow and they will continue to become tender. A yellow fluid (colostrum) may leak from your breasts. This is the first milk you are producing for your baby.  Your belly button may stick out.  You may notice more swelling in your hands,  face, or ankles.  You may have increased tingling or numbness in your hands, arms, and legs. The skin on your belly may also feel numb.  You may feel short of breath because of your expanding uterus.  You may have more problems sleeping. This can be caused by the size of your belly, increased need to urinate, and an increase in your body's metabolism.  You may notice the fetus "dropping," or moving lower in your abdomen (lightening).  You may have increased vaginal discharge.  You may notice your joints feel loose and you may have pain around your pelvic bone.  What to expect at prenatal visits You will have prenatal exams every 2 weeks until week 36. Then you will have weekly prenatal exams. During a routine prenatal visit:  You will be weighed to make sure you and the baby are growing normally.  Your blood pressure will be taken.  Your abdomen will be measured to track your baby's growth.  The fetal heartbeat will be listened to.  Any test results from the previous visit will be discussed.  You may have a cervical check near your due date to see if your cervix has softened or thinned (effaced).  You will be tested for Group B streptococcus. This happens between 35 and 37 weeks.  Your health care provider may ask you:  What your birth plan is.  How you are feeling.  If you are feeling the baby move.  If you have had   any abnormal symptoms, such as leaking fluid, bleeding, severe headaches, or abdominal cramping.  If you are using any tobacco products, including cigarettes, chewing tobacco, and electronic cigarettes.  If you have any questions.  Other tests or screenings that may be performed during your third trimester include:  Blood tests that check for low iron levels (anemia).  Fetal testing to check the health, activity level, and growth of the fetus. Testing is done if you have certain medical conditions or if there are problems during the  pregnancy.  Nonstress test (NST). This test checks the health of your baby to make sure there are no signs of problems, such as the baby not getting enough oxygen. During this test, a belt is placed around your belly. The baby is made to move, and its heart rate is monitored during movement.  What is false labor? False labor is a condition in which you feel small, irregular tightenings of the muscles in the womb (contractions) that usually go away with rest, changing position, or drinking water. These are called Braxton Hicks contractions. Contractions may last for hours, days, or even weeks before true labor sets in. If contractions come at regular intervals, become more frequent, increase in intensity, or become painful, you should see your health care provider. What are the signs of labor?  Abdominal cramps.  Regular contractions that start at 10 minutes apart and become stronger and more frequent with time.  Contractions that start on the top of the uterus and spread down to the lower abdomen and back.  Increased pelvic pressure and dull back pain.  A watery or bloody mucus discharge that comes from the vagina.  Leaking of amniotic fluid. This is also known as your "water breaking." It could be a slow trickle or a gush. Let your health care provider know if it has a color or strange odor. If you have any of these signs, call your health care provider right away, even if it is before your due date. Follow these instructions at home: Medicines  Follow your health care provider's instructions regarding medicine use. Specific medicines may be either safe or unsafe to take during pregnancy.  Take a prenatal vitamin that contains at least 600 micrograms (mcg) of folic acid.  If you develop constipation, try taking a stool softener if your health care provider approves. Eating and drinking  Eat a balanced diet that includes fresh fruits and vegetables, whole grains, good sources of protein  such as meat, eggs, or tofu, and low-fat dairy. Your health care provider will help you determine the amount of weight gain that is right for you.  Avoid raw meat and uncooked cheese. These carry germs that can cause birth defects in the baby.  If you have low calcium intake from food, talk to your health care provider about whether you should take a daily calcium supplement.  Eat four or five small meals rather than three large meals a day.  Limit foods that are high in fat and processed sugars, such as fried and sweet foods.  To prevent constipation: ? Drink enough fluid to keep your urine clear or pale yellow. ? Eat foods that are high in fiber, such as fresh fruits and vegetables, whole grains, and beans. Activity  Exercise only as directed by your health care provider. Most women can continue their usual exercise routine during pregnancy. Try to exercise for 30 minutes at least 5 days a week. Stop exercising if you experience uterine contractions.  Avoid heavy   lifting.  Do not exercise in extreme heat or humidity, or at high altitudes.  Wear low-heel, comfortable shoes.  Practice good posture.  You may continue to have sex unless your health care provider tells you otherwise. Relieving pain and discomfort  Take frequent breaks and rest with your legs elevated if you have leg cramps or low back pain.  Take warm sitz baths to soothe any pain or discomfort caused by hemorrhoids. Use hemorrhoid cream if your health care provider approves.  Wear a good support bra to prevent discomfort from breast tenderness.  If you develop varicose veins: ? Wear support pantyhose or compression stockings as told by your healthcare provider. ? Elevate your feet for 15 minutes, 3-4 times a day. Prenatal care  Write down your questions. Take them to your prenatal visits.  Keep all your prenatal visits as told by your health care provider. This is important. Safety  Wear your seat belt at  all times when driving.  Make a list of emergency phone numbers, including numbers for family, friends, the hospital, and police and fire departments. General instructions  Avoid cat litter boxes and soil used by cats. These carry germs that can cause birth defects in the baby. If you have a cat, ask someone to clean the litter box for you.  Do not travel far distances unless it is absolutely necessary and only with the approval of your health care provider.  Do not use hot tubs, steam rooms, or saunas.  Do not drink alcohol.  Do not use any products that contain nicotine or tobacco, such as cigarettes and e-cigarettes. If you need help quitting, ask your health care provider.  Do not use any medicinal herbs or unprescribed drugs. These chemicals affect the formation and growth of the baby.  Do not douche or use tampons or scented sanitary pads.  Do not cross your legs for long periods of time.  To prepare for the arrival of your baby: ? Take prenatal classes to understand, practice, and ask questions about labor and delivery. ? Make a trial run to the hospital. ? Visit the hospital and tour the maternity area. ? Arrange for maternity or paternity leave through employers. ? Arrange for family and friends to take care of pets while you are in the hospital. ? Purchase a rear-facing car seat and make sure you know how to install it in your car. ? Pack your hospital bag. ? Prepare the baby's nursery. Make sure to remove all pillows and stuffed animals from the baby's crib to prevent suffocation.  Visit your dentist if you have not gone during your pregnancy. Use a soft toothbrush to brush your teeth and be gentle when you floss. Contact a health care provider if:  You are unsure if you are in labor or if your water has broken.  You become dizzy.  You have mild pelvic cramps, pelvic pressure, or nagging pain in your abdominal area.  You have lower back pain.  You have persistent  nausea, vomiting, or diarrhea.  You have an unusual or bad smelling vaginal discharge.  You have pain when you urinate. Get help right away if:  Your water breaks before 37 weeks.  You have regular contractions less than 5 minutes apart before 37 weeks.  You have a fever.  You are leaking fluid from your vagina.  You have spotting or bleeding from your vagina.  You have severe abdominal pain or cramping.  You have rapid weight loss or weight gain.    You have shortness of breath with chest pain.  You notice sudden or extreme swelling of your face, hands, ankles, feet, or legs.  Your baby makes fewer than 10 movements in 2 hours.  You have severe headaches that do not go away when you take medicine.  You have vision changes. Summary  The third trimester is from week 28 through week 40, months 7 through 9. The third trimester is a time when the unborn baby (fetus) is growing rapidly.  During the third trimester, your discomfort may increase as you and your baby continue to gain weight. You may have abdominal, leg, and back pain, sleeping problems, and an increased need to urinate.  During the third trimester your breasts will keep growing and they will continue to become tender. A yellow fluid (colostrum) may leak from your breasts. This is the first milk you are producing for your baby.  False labor is a condition in which you feel small, irregular tightenings of the muscles in the womb (contractions) that eventually go away. These are called Braxton Hicks contractions. Contractions may last for hours, days, or even weeks before true labor sets in.  Signs of labor can include: abdominal cramps; regular contractions that start at 10 minutes apart and become stronger and more frequent with time; watery or bloody mucus discharge that comes from the vagina; increased pelvic pressure and dull back pain; and leaking of amniotic fluid. This information is not intended to replace advice  given to you by your health care provider. Make sure you discuss any questions you have with your health care provider. Document Released: 03/02/2001 Document Revised: 08/14/2015 Document Reviewed: 05/09/2012 Elsevier Interactive Patient Education  2017 [employer]  

## 2018-03-02 NOTE — Progress Notes (Addendum)
Routine Prenatal Care Visit  Subjective  Lindsay AlbertsMikesha A Wagner is a 27 y.o. [phone] at 8764w1d being seen today for ongoing prenatal care.  She is currently monitored for the following issues for this high-risk pregnancy and has BMI 40.0-44.9, adult (HCC); Morbid obesity (HCC); History of cervical incompetence; Supervision of high risk pregnancy, antepartum, third trimester; History of preterm delivery; Hyperemesis affecting pregnancy, antepartum; Gestational diabetes; Cervical cerclage suture present; and Fetal arrhythmia ? on their problem list.  ----------------------------------------------------------------------------------- Patient reports Braxton-Hicks contractions, especially when she is busy at work.   Contractions: Irregular. Vag. Bleeding: None.  Movement: Present. No leaking of fluid.  ----------------------------------------------------------------------------------- The following portions of the patient's history were reviewed and updated as appropriate: allergies, current medications, past family history, past medical history, past social history, past surgical history and problem list. Problem list updated.  Objective  Blood pressure 110/70, weight 235 lb (106.6 kg), last menstrual period 07/25/2017. Pregravid weight 245 lb (111.1 kg) Total Weight Gain -10 lb (-4.536 kg) Body mass index is 40.34 kg/m.   Urinalysis: Protein Negative, Glucose Negative  Fetal Status: Fetal Heart Rate (bpm): 132   Movement: Present     General:  Alert, oriented and cooperative. Patient is in no acute distress.  Skin: Skin is warm and dry. No rash noted.   Cardiovascular: Normal heart rate noted  Respiratory: Normal respiratory effort, no problems with respiration noted  Abdomen: Soft, gravid, appropriate for gestational age. Pain/Pressure: Absent     Pelvic:  Cervical exam deferred        Extremities: Normal range of motion.  Edema: None  Mental Status: Normal mood and affect. Normal behavior.  Normal judgment and thought content.   NST Baseline: 125 Variability: moderate Accelerations: present Decelerations: absent Tocometry: not done The patient was monitored for 20+minutes, fetal heart rate tracing was deemed reactive.  Assessment   27 y.o. [id number] at 1564w1d, EDD 04/26/2018 by Ultrasound presenting for a routine prenatal visit.  Plan   Pregnancy #3 Problems (from 09/06/17 to present)    Problem Noted Resolved   Cervical cerclage suture present 12/07/2017 by Natale MilchSchuman, Christanna R, MD No   Gestational diabetes 10/25/2017 by Conard NovakJackson, Stephen D, MD No   History of preterm delivery 10/11/2017 by Natale MilchSchuman, Christanna R, MD No   Hyperemesis affecting pregnancy, antepartum 10/11/2017 by Natale MilchSchuman, Christanna R, MD No   Supervision of high risk pregnancy, antepartum, third trimester 09/06/2017 by Natale MilchSchuman, Christanna R, MD No   Overview Addendum 01/21/2018  8:14 AM by Farrel ConnersGutierrez, Colleen, CNM      Clinic [facility] Prenatal Labs  Dating 7wk6d ultrasound Blood type: A/Positive/-- (06/18 1039)   Genetic Screen 1 Screen:    Negative Antibody:Negative (06/18 1039)  Anatomic US Complete/ Baby Journey Rubella: 1.81 (06/18 1039) Varicella: Immune  GTT Early: 1hr and 3 hr elevated       28 wk:      RPR: Non Reactive (06/18 1039)   Rhogam  not indicated HBsAg: Negative (06/18 1039)   TDaP vaccine                       HIV: Non Reactive (06/18 1039)   Flu Shot 12/07/17 GBS:   Contraception Tubal [x ] sign consent Pap: 2017 NIL  CBB     CS/VBAC Not applicable   Baby Food Breast, tried before for 2 days but had difficulty with latching   Support Person   Husband Christiane HaJonathan     CERCLAGE at 12 weeks  Morbid obesity (HCC) 07/04/2017 by Natale Milch, MD No   Overview Signed 10/11/2017 10:50 AM by Natale Milch, MD    BMI >=40 [ X] early 1h gtt -  [ ]  u/s for dating [ ]   [ ]  nutritional goals [ ]  folic acid 1mg  [ ]  bASA (>12 weeks) [ ]  consider nutrition consult [ ]  consider  maternal EKG 1st trimester [ ]  Growth u/s 28 [ ] , 32 [ ] , 36 weeks [ ]  [ ]  NST/AFI weekly 36+ weeks (36[] , 37[] , 38[] , 39[] , 40[] ) [ ]  IOL by 41 weeks (scheduled, prn [] ) [ ]  anesthesia consult      History of cervical incompetence 07/04/2017 by Natale Milch, MD No   Overview Signed 10/11/2017 10:46 AM by Natale Milch, MD    [ ]  Cerclage at [redacted] weeks gestation       NST reactive after vibroacoustic stimulation. AFI 10.5 cm.  Blood glucose log reviewed with almost all normal values: fasting range 70-84, postprandial range 87-118 except for a single after lunch value of 121. Good control with diet.  Please refer to After Visit Summary for other counseling recommendations.   Return in about 1 week (around 03/09/2018) for ROB with NST/growth scan.  [name], CNM 03/02/2018  10:50 AM

## 2018-03-08 ENCOUNTER — Ambulatory Visit (INDEPENDENT_AMBULATORY_CARE_PROVIDER_SITE_OTHER): Payer: 59

## 2018-03-08 ENCOUNTER — Ambulatory Visit (INDEPENDENT_AMBULATORY_CARE_PROVIDER_SITE_OTHER): Payer: 59 | Admitting: Obstetrics & Gynecology

## 2018-03-08 VITALS — BP 120/80 | Wt 233.0 lb

## 2018-03-08 DIAGNOSIS — Z3689 Encounter for other specified antenatal screening: Secondary | ICD-10-CM

## 2018-03-08 DIAGNOSIS — O24410 Gestational diabetes mellitus in pregnancy, diet controlled: Secondary | ICD-10-CM

## 2018-03-08 DIAGNOSIS — Z6841 Body Mass Index (BMI) 40.0 and over, adult: Secondary | ICD-10-CM

## 2018-03-08 DIAGNOSIS — O0993 Supervision of high risk pregnancy, unspecified, third trimester: Secondary | ICD-10-CM

## 2018-03-08 DIAGNOSIS — Z3A33 33 weeks gestation of pregnancy: Secondary | ICD-10-CM

## 2018-03-08 DIAGNOSIS — Z8759 Personal history of other complications of pregnancy, childbirth and the puerperium: Secondary | ICD-10-CM

## 2018-03-08 DIAGNOSIS — Z8742 Personal history of other diseases of the female genital tract: Secondary | ICD-10-CM

## 2018-03-08 DIAGNOSIS — O09293 Supervision of pregnancy with other poor reproductive or obstetric history, third trimester: Secondary | ICD-10-CM

## 2018-03-08 LAB — POCT URINALYSIS DIPSTICK OB: GLUCOSE, UA: NEGATIVE

## 2018-03-08 NOTE — Progress Notes (Signed)
  Subjective  Fetal Movement? yes Contractions? no Leaking Fluid? no Vaginal Bleeding? no  Objective  BP 120/80   Wt 233 lb (105.7 kg)   LMP 07/25/2017 (Approximate)   BMI 39.99 kg/m  General: NAD Pumonary: no increased work of breathing Abdomen: gravid, non-tender Extremities: no edema Psychiatric: mood appropriate, affect full  Assessment  27 y.o. [id number] at 1059w0d by  04/26/2018, by Ultrasound presenting for routine prenatal visit  Plan   Problem List Items Addressed This Visit      Endocrine   Gestational diabetes     Genitourinary   History of cervical incompetence     Other   Supervision of high risk pregnancy, antepartum, third trimester    Other Visit Diagnoses    [redacted] weeks gestation of pregnancy    -  Primary   Relevant Orders   POC Urinalysis Dipstick OB (Completed)   Encounter for ultrasound to assess fetal growth        Review of ULTRASOUND.    I have personally reviewed images and [facility]    Plan of management to be discussed with patient. Normal growth, Vtx, normal AFI  Current Diabetic Medications:  None  Well controlled BS on check today  [x ] Aspirin 81 mg daily after 12 weeks; discontinue after 36 weeks (? A2/B GDM)  Baseline and surveillance labs [facility] refresh links as needed)  Lab Results  Component Value Date   PROTEIN24HR 91 11/21/2017   Antenatal Testing Class of DM U/S NST/AFI DELIVERY  Diabetes   A1 - good control - O24.410    A2 - good control - O24.419      A2  - poor control or poor compliance - O24.419, E11.65   (Macrosomia or polyhydramnios) **E11.65 is extra code for poor control**    A2/B - O24.919  and B-C O24.319  Poor control B-C or D-R-F-T - O24.319  or  Type I DM - O24.019  20-38  20-38  20-24-28-32-36   20-24-28-32-35-38//fetal echo  20-24-27-30-33-36-38//fetal echo  40  32//2 x wk  32//2 x wk   32//2 x wk  28//BPP wkly then 32//2 x wk   40  39  PRN   39  PRN   A NST procedure was performed with FHR monitoring and a normal baseline established, appropriate time of 20-40 minutes of evaluation, and accels >2 seen w 15x15 characteristics.  Results show a REACTIVE NST.   No further FHR concerns or maternal heart rate sx's Plan repeat US 3 weeks for growth and AFI NST not required weekly at this point  Plan Cerclage removal 36-37 weeks  Plans PP BTL, discussed pros and cons  Annamarie MajorPaul Rikki Smestad, MD, Merlinda FrederickFACOG [facility], [facility] 03/08/2018  9:04 AM

## 2018-03-16 ENCOUNTER — Ambulatory Visit (INDEPENDENT_AMBULATORY_CARE_PROVIDER_SITE_OTHER): Payer: 59 | Admitting: Certified Nurse Midwife

## 2018-03-16 VITALS — BP 100/50 | Wt 235.5 lb

## 2018-03-16 DIAGNOSIS — O99213 Obesity complicating pregnancy, third trimester: Secondary | ICD-10-CM

## 2018-03-16 DIAGNOSIS — O0993 Supervision of high risk pregnancy, unspecified, third trimester: Secondary | ICD-10-CM

## 2018-03-16 DIAGNOSIS — Z3A34 34 weeks gestation of pregnancy: Secondary | ICD-10-CM

## 2018-03-16 DIAGNOSIS — O24410 Gestational diabetes mellitus in pregnancy, diet controlled: Secondary | ICD-10-CM

## 2018-03-16 LAB — POCT URINALYSIS DIPSTICK OB: Glucose, UA: NEGATIVE

## 2018-03-16 NOTE — Progress Notes (Signed)
No [email or web address].rj 

## 2018-03-20 ENCOUNTER — Other Ambulatory Visit: Payer: Self-pay | Admitting: Obstetrics and Gynecology

## 2018-03-20 DIAGNOSIS — O24410 Gestational diabetes mellitus in pregnancy, diet controlled: Secondary | ICD-10-CM

## 2018-03-21 ENCOUNTER — Encounter: Payer: Self-pay | Admitting: Dietician

## 2018-03-21 NOTE — Progress Notes (Signed)
Have not heard back from patient after last message left for her on 03/02/28. Sent letter to referring provider.

## 2018-03-22 NOTE — L&D Delivery Note (Signed)
Date of delivery: 03/28/2018 Estimated Date of Delivery: 04/26/18 Patient's last menstrual period was 07/25/2017 (approximate). EGA: [redacted]w[redacted]d  Delivery Note At 5:30 AM a viable female was delivered via Vaginal, Spontaneous (Presentation: OA;  LOA).  APGAR: 8, 9; weight: 2750 g, 6 pounds 1 ounce   Placenta status: spontaneous, intact.  Cord:  with the following complications: none.  Cord pH: NA  Patient arrived to triage 35 minutes after SROM at 2:11 AM and found to be grossly ruptured. Dr Jean Rosenthal was called to remove cervical cerclage which occurred at 4:42 AM. She was complete and pushing about 30 minutes later. Mom pushed well to deliver a viable female infant.  The head followed by shoulders, which delivered without difficulty, and the rest of the body.  No nuchal cord noted.  Baby to mom's chest.  Cord clamped and cut after 3 min delay.  No cord blood obtained.  Placenta delivered spontaneously, intact, with a 3-vessel cord.  Perineum intact and no other lacerations.  All counts correct.  Hemostasis obtained with IV pitocin and fundal massage.   Anesthesia: none  Episiotomy: None Lacerations: None Suture Repair: NA Est. Blood Loss (mL): 450  Mom to postpartum.  Baby to Couplet care / Skin to Skin.  Lindsay Wagner, CNM 03/28/2018, 6:20 AM

## 2018-03-23 ENCOUNTER — Encounter: Payer: Self-pay | Admitting: Advanced Practice Midwife

## 2018-03-23 ENCOUNTER — Ambulatory Visit (INDEPENDENT_AMBULATORY_CARE_PROVIDER_SITE_OTHER): Payer: 59 | Admitting: Advanced Practice Midwife

## 2018-03-23 VITALS — BP 100/60 | Wt 237.0 lb

## 2018-03-23 DIAGNOSIS — O09293 Supervision of pregnancy with other poor reproductive or obstetric history, third trimester: Secondary | ICD-10-CM

## 2018-03-23 DIAGNOSIS — Z3A35 35 weeks gestation of pregnancy: Secondary | ICD-10-CM

## 2018-03-23 LAB — POCT URINALYSIS DIPSTICK OB

## 2018-03-23 NOTE — Patient Instructions (Signed)
Braxton Hicks Contractions Contractions of the uterus can occur throughout pregnancy, but they are not always a sign that you are in labor. You may have practice contractions called Braxton Hicks contractions. These false labor contractions are sometimes confused with true labor. What are Braxton Hicks contractions? Braxton Hicks contractions are tightening movements that occur in the muscles of the uterus before labor. Unlike true labor contractions, these contractions do not result in opening (dilation) and thinning of the cervix. Toward the end of pregnancy (32-34 weeks), Braxton Hicks contractions can happen more often and may become stronger. These contractions are sometimes difficult to tell apart from true labor because they can be very uncomfortable. You should not feel embarrassed if you go to the hospital with false labor. Sometimes, the only way to tell if you are in true labor is for your health care provider to look for changes in the cervix. The health care provider will do a physical exam and may monitor your contractions. If you are not in true labor, the exam should show that your cervix is not dilating and your water has not broken. If there are no other health problems associated with your pregnancy, it is completely safe for you to be sent home with false labor. You may continue to have Braxton Hicks contractions until you go into true labor. How to tell the difference between true labor and false labor True labor  Contractions last 30-70 seconds.  Contractions become very regular.  Discomfort is usually felt in the top of the uterus, and it spreads to the lower abdomen and low back.  Contractions do not go away with walking.  Contractions usually become more intense and increase in frequency.  The cervix dilates and gets thinner. False labor  Contractions are usually shorter and not as strong as true labor contractions.  Contractions are usually irregular.  Contractions  are often felt in the front of the lower abdomen and in the groin.  Contractions may go away when you walk around or change positions while lying down.  Contractions get weaker and are shorter-lasting as time goes on.  The cervix usually does not dilate or become thin. Follow these instructions at home:   Take over-the-counter and prescription medicines only as told by your health care provider.  Keep up with your usual exercises and follow other instructions from your health care provider.  Eat and drink lightly if you think you are going into labor.  If Braxton Hicks contractions are making you uncomfortable: ? Change your position from lying down or resting to walking, or change from walking to resting. ? Sit and rest in a tub of warm water. ? Drink enough fluid to keep your urine pale yellow. Dehydration may cause these contractions. ? Do slow and deep breathing several times an hour.  Keep all follow-up prenatal visits as told by your health care provider. This is important. Contact a health care provider if:  You have a fever.  You have continuous pain in your abdomen. Get help right away if:  Your contractions become stronger, more regular, and closer together.  You have fluid leaking or gushing from your vagina.  You pass blood-tinged mucus (bloody show).  You have bleeding from your vagina.  You have low back pain that you never had before.  You feel your baby's head pushing down and causing pelvic pressure.  Your baby is not moving inside you as much as it used to. Summary  Contractions that occur before labor are   called Braxton Hicks contractions, false labor, or practice contractions.  Braxton Hicks contractions are usually shorter, weaker, farther apart, and less regular than true labor contractions. True labor contractions usually become progressively stronger and regular, and they become more frequent.  Manage discomfort from Braxton Hicks contractions  by changing position, resting in a warm bath, drinking plenty of water, or practicing deep breathing. This information is not intended to replace advice given to you by your health care provider. Make sure you discuss any questions you have with your health care provider. Document Released: 07/22/2016 Document Revised: 12/21/2016 Document Reviewed: 07/22/2016 Elsevier Interactive Patient Education  2019 [employer]  

## 2018-03-23 NOTE — Addendum Note (Signed)
Addended by: Cornelius MorasPATTERSON, Gerell Fortson D on: 03/23/2018 08:30 AM   Modules accepted: Orders

## 2018-03-23 NOTE — Progress Notes (Signed)
Routine Prenatal Care Visit  Subjective  Lindsay Wagner is a 28 y.o. [phone] at 3751w1d being seen today for ongoing prenatal care.  She is currently monitored for the following issues for this high-risk pregnancy and has BMI 40.0-44.9, adult (HCC); Morbid obesity (HCC); History of cervical incompetence; Supervision of high risk pregnancy, antepartum, third trimester; History of preterm delivery; Hyperemesis affecting pregnancy, antepartum; Gestational diabetes; Cervical cerclage suture present; and Fetal arrhythmia ? on their problem list.  ----------------------------------------------------------------------------------- Patient reports no complaints.   Contractions: Not present. Vag. Bleeding: None.  Movement: Present. Denies leaking of fluid.  ----------------------------------------------------------------------------------- The following portions of the patient's history were reviewed and updated as appropriate: allergies, current medications, past family history, past medical history, past social history, past surgical history and problem list. Problem list updated.   Objective  Blood pressure 100/60, weight 237 lb (107.5 kg), last menstrual period 07/25/2017. Pregravid weight 245 lb (111.1 kg) Total Weight Gain -8 lb (-3.629 kg) Urinalysis: Urine Protein    Urine Glucose    Fetal Status: Fetal Heart Rate (bpm): 138 Fundal Height: 37 cm Movement: Present     General:  Alert, oriented and cooperative. Patient is in no acute distress.  Skin: Skin is warm and dry. No rash noted.   Cardiovascular: Normal heart rate noted  Respiratory: Normal respiratory effort, no problems with respiration noted  Abdomen: Soft, gravid, appropriate for gestational age. Pain/Pressure: Absent     Pelvic:  Cervical exam deferred        Extremities: Normal range of motion.  Edema: None  Mental Status: Normal mood and affect. Normal behavior. Normal judgment and thought content.   Assessment   28 y.o.  [id number] at 5351w1d by  04/26/2018, by Ultrasound presenting for routine prenatal visit  Plan   Pregnancy #3 Problems (from 09/06/17 to present)    Problem Noted Resolved   Cervical cerclage suture present 12/07/2017 by Natale MilchSchuman, Christanna R, MD No   Gestational diabetes 10/25/2017 by Conard NovakJackson, Stephen D, MD No   History of preterm delivery 10/11/2017 by Natale MilchSchuman, Christanna R, MD No   Hyperemesis affecting pregnancy, antepartum 10/11/2017 by Natale MilchSchuman, Christanna R, MD No   Supervision of high risk pregnancy, antepartum, third trimester 09/06/2017 by Natale MilchSchuman, Christanna R, MD No   Overview Addendum 01/21/2018  8:14 AM by Farrel ConnersGutierrez, Colleen, CNM      Clinic [facility] Prenatal Labs  Dating 7wk6d ultrasound Blood type: A/Positive/-- (06/18 1039)   Genetic Screen 1 Screen:    Negative Antibody:Negative (06/18 1039)  Anatomic US Complete/ Baby Journey Rubella: 1.81 (06/18 1039) Varicella: Immune  GTT Early: 1hr and 3 hr elevated       28 wk:      RPR: Non Reactive (06/18 1039)   Rhogam  not indicated HBsAg: Negative (06/18 1039)   TDaP vaccine                       HIV: Non Reactive (06/18 1039)   Flu Shot 12/07/17 GBS:   Contraception Tubal [x ] sign consent Pap: 2017 NIL  CBB     CS/VBAC Not applicable   Baby Food Breast, tried before for 2 days but had difficulty with latching   Support Person   Husband Rachell CiproJonathan     CERCLAGE at 12 weeks       Morbid obesity (HCC) 07/04/2017 by Natale MilchSchuman, Christanna R, MD No   Overview Signed 10/11/2017 10:50 AM by Natale MilchSchuman, Christanna R, MD    BMI >=40 [ X]  early 1h gtt -  [ ]  u/s for dating [ ]   [ ]  nutritional goals [ ]  folic acid 1mg  [ ]  bASA (>12 weeks) [ ]  consider nutrition consult [ ]  consider maternal EKG 1st trimester [ ]  Growth u/s 28 [ ] , 32 [ ] , 36 weeks [ ]  [ ]  NST/AFI weekly 36+ weeks (36[] , 37[] , 38[] , 39[] , 40[] ) [ ]  IOL by 41 weeks (scheduled, prn [] ) [ ]  anesthesia consult      History of cervical incompetence 07/04/2017 by Natale Milch, MD No   Overview Signed 10/11/2017 10:46 AM by Natale Milch, MD    [ ]  Cerclage at [redacted] weeks gestation          Preterm labor symptoms and general obstetric precautions including but not limited to vaginal bleeding, contractions, leaking of fluid and fetal movement were reviewed in detail with the patient. Please refer to After Visit Summary for other counseling recommendations.   AFI/NST next visit  Return in about 1 week (around 03/30/2018) for has f/u scheduled.  Tresea Mall, CNM 03/23/2018 8:26 AM

## 2018-03-26 NOTE — Progress Notes (Signed)
HROB at 34wk1d: Hx of preterm labor (has cerclage)/ GDMA1/ BMI> 40. Good FM.  Brings blood sugar log: All fasting levels meet goal. 2 hour pp: all meet goal except for 2 values (120, 122) FHTs wNL Ultrasound 12/18 with EFW 37.4% (4#6oz) and normal AFI.  Discussed fetal kick counts ROB in 1 week Needs to see MD in 2 weeks for cerclage removal Start NSTs and AFIs at 36 weeks Needs growth scan in 3 weeks Farrel Conners, CNM

## 2018-03-28 ENCOUNTER — Inpatient Hospital Stay
Admission: EM | Admit: 2018-03-28 | Discharge: 2018-03-30 | DRG: 768 | Disposition: A | Discharge status: Home / Self Care | Payer: 59 | Attending: Obstetrics and Gynecology | Admitting: Obstetrics and Gynecology

## 2018-03-28 ENCOUNTER — Other Ambulatory Visit: Payer: Self-pay

## 2018-03-28 DIAGNOSIS — O42913 Preterm premature rupture of membranes, unspecified as to length of time between rupture and onset of labor, third trimester: Principal | ICD-10-CM | POA: Diagnosis present

## 2018-03-28 DIAGNOSIS — O9081 Anemia of the puerperium: Secondary | ICD-10-CM | POA: Diagnosis not present

## 2018-03-28 DIAGNOSIS — O24420 Gestational diabetes mellitus in childbirth, diet controlled: Secondary | ICD-10-CM | POA: Diagnosis present

## 2018-03-28 DIAGNOSIS — Z9851 Tubal ligation status: Secondary | ICD-10-CM

## 2018-03-28 DIAGNOSIS — D62 Acute posthemorrhagic anemia: Secondary | ICD-10-CM | POA: Diagnosis not present

## 2018-03-28 DIAGNOSIS — E669 Obesity, unspecified: Secondary | ICD-10-CM | POA: Diagnosis present

## 2018-03-28 DIAGNOSIS — O3433 Maternal care for cervical incompetence, third trimester: Secondary | ICD-10-CM | POA: Diagnosis present

## 2018-03-28 DIAGNOSIS — O99214 Obesity complicating childbirth: Secondary | ICD-10-CM | POA: Diagnosis present

## 2018-03-28 DIAGNOSIS — O42013 Preterm premature rupture of membranes, onset of labor within 24 hours of rupture, third trimester: Secondary | ICD-10-CM

## 2018-03-28 DIAGNOSIS — Z302 Encounter for sterilization: Secondary | ICD-10-CM

## 2018-03-28 DIAGNOSIS — Z3A35 35 weeks gestation of pregnancy: Secondary | ICD-10-CM

## 2018-03-28 LAB — CBC
HCT: 31.9 % — ABNORMAL LOW (ref 36.0–46.0)
Hemoglobin: 10 g/dL — ABNORMAL LOW (ref 12.0–15.0)
MCH: 28.5 pg (ref 26.0–34.0)
MCHC: 31.3 g/dL (ref 30.0–36.0)
MCV: 90.9 fL (ref 80.0–100.0)
Platelets: 296 10*3/uL (ref 150–400)
RBC: 3.51 MIL/uL — AB (ref 3.87–5.11)
RDW: 13 % (ref 11.5–15.5)
WBC: 14 10*3/uL — ABNORMAL HIGH (ref 4.0–10.5)
nRBC: 0 % (ref 0.0–0.2)

## 2018-03-28 LAB — TYPE AND SCREEN
ABO/RH(D): A POS
Antibody Screen: NEGATIVE

## 2018-03-28 LAB — RUPTURE OF MEMBRANE (ROM)PLUS: Rom Plus: POSITIVE

## 2018-03-28 LAB — GLUCOSE, CAPILLARY: GLUCOSE-CAPILLARY: 79 mg/dL (ref 70–99)

## 2018-03-28 LAB — GROUP B STREP BY PCR: Group B strep by PCR: NEGATIVE

## 2018-03-28 MED ORDER — SIMETHICONE 80 MG PO CHEW: 80.0000 mg | CHEWABLE_TABLET | ORAL | Status: DC | PRN

## 2018-03-28 MED ORDER — LACTATED RINGERS IV SOLN: INTRAVENOUS | Status: DC

## 2018-03-28 MED ORDER — WITCH HAZEL-GLYCERIN EX PADS: 1.0000 "application " | MEDICATED_PAD | CUTANEOUS | Status: DC | PRN

## 2018-03-28 MED ORDER — INSULIN REGULAR(HUMAN) IN NACL 100-0.9 UT/100ML-% IV SOLN
INTRAVENOUS | Status: DC
  Filled 2018-03-28: qty 100

## 2018-03-28 MED ORDER — MISOPROSTOL 200 MCG PO TABS
ORAL_TABLET | ORAL | Status: AC
  Filled 2018-03-28: qty 4

## 2018-03-28 MED ORDER — DEXTROSE-NACL 5-0.45 % IV SOLN
INTRAVENOUS | Status: DC
  Filled 2018-03-28: qty 1000

## 2018-03-28 MED ORDER — ONDANSETRON HCL 4 MG PO TABS: 4.0000 mg | ORAL_TABLET | ORAL | Status: DC | PRN

## 2018-03-28 MED ORDER — ACETAMINOPHEN 325 MG PO TABS
650.0000 mg | ORAL_TABLET | ORAL | Status: DC | PRN
  Administered 2018-03-29: 650 mg via ORAL
  Filled 2018-03-28: qty 2

## 2018-03-28 MED ORDER — LACTATED RINGERS IV SOLN: 500.0000 mL | INTRAVENOUS | Status: DC | PRN

## 2018-03-28 MED ORDER — COCONUT OIL OIL
1.0000 "application " | TOPICAL_OIL | Status: DC | PRN
  Administered 2018-03-28: 1 via TOPICAL
  Filled 2018-03-28: qty 120

## 2018-03-28 MED ORDER — DIPHENHYDRAMINE HCL 25 MG PO CAPS: 25.0000 mg | ORAL_CAPSULE | Freq: Four times a day (QID) | ORAL | Status: DC | PRN

## 2018-03-28 MED ORDER — OXYTOCIN 10 UNIT/ML IJ SOLN
INTRAMUSCULAR | Status: AC
  Administered 2018-03-28: 10 [IU]
  Filled 2018-03-28: qty 2

## 2018-03-28 MED ORDER — INSULIN REGULAR BOLUS VIA INFUSION
0.0000 [IU] | Freq: Three times a day (TID) | INTRAVENOUS | Status: DC
  Filled 2018-03-28: qty 10

## 2018-03-28 MED ORDER — ACETAMINOPHEN 325 MG PO TABS: 650.0000 mg | ORAL_TABLET | ORAL | Status: DC | PRN

## 2018-03-28 MED ORDER — OXYTOCIN BOLUS FROM INFUSION: 500.0000 mL | Freq: Once | INTRAVENOUS | Status: DC

## 2018-03-28 MED ORDER — SODIUM CHLORIDE 0.9 % IV SOLN: INTRAVENOUS | Status: DC

## 2018-03-28 MED ORDER — DIBUCAINE 1 % RE OINT: 1.0000 "application " | TOPICAL_OINTMENT | RECTAL | Status: DC | PRN

## 2018-03-28 MED ORDER — PRENATAL MULTIVITAMIN CH
1.0000 | ORAL_TABLET | Freq: Every day | ORAL | Status: DC
  Administered 2018-03-28 – 2018-03-30 (×2): 1 via ORAL
  Filled 2018-03-28 (×2): qty 1

## 2018-03-28 MED ORDER — BUTORPHANOL TARTRATE 1 MG/ML IJ SOLN
1.0000 mg | INTRAMUSCULAR | Status: DC | PRN
  Filled 2018-03-28: qty 1

## 2018-03-28 MED ORDER — ONDANSETRON HCL 4 MG/2ML IJ SOLN
4.0000 mg | INTRAMUSCULAR | Status: DC | PRN
  Administered 2018-03-29: 4 mg via INTRAVENOUS

## 2018-03-28 MED ORDER — OXYTOCIN 40 UNITS IN LACTATED RINGERS INFUSION - SIMPLE MED: 2.5000 [IU]/h | INTRAVENOUS | Status: DC

## 2018-03-28 MED ORDER — SENNOSIDES-DOCUSATE SODIUM 8.6-50 MG PO TABS
2.0000 | ORAL_TABLET | ORAL | Status: DC
  Administered 2018-03-28 – 2018-03-29 (×2): 2 via ORAL
  Filled 2018-03-28 (×2): qty 2

## 2018-03-28 MED ORDER — ONDANSETRON HCL 4 MG/2ML IJ SOLN: 4.0000 mg | Freq: Four times a day (QID) | INTRAMUSCULAR | Status: DC | PRN

## 2018-03-28 MED ORDER — BENZOCAINE-MENTHOL 20-0.5 % EX AERO: 1.0000 "application " | INHALATION_SPRAY | CUTANEOUS | Status: DC | PRN

## 2018-03-28 MED ORDER — AMMONIA AROMATIC IN INHA
RESPIRATORY_TRACT | Status: AC
  Filled 2018-03-28: qty 10

## 2018-03-28 MED ORDER — LIDOCAINE HCL (PF) 1 % IJ SOLN: 30.0000 mL | INTRAMUSCULAR | Status: DC | PRN

## 2018-03-28 MED ORDER — DEXTROSE 50 % IV SOLN: 25.0000 mL | INTRAVENOUS | Status: DC | PRN

## 2018-03-28 MED ORDER — IBUPROFEN 600 MG PO TABS
600.0000 mg | ORAL_TABLET | Freq: Four times a day (QID) | ORAL | Status: DC
  Administered 2018-03-28 – 2018-03-29 (×4): 600 mg via ORAL
  Filled 2018-03-28 (×4): qty 1

## 2018-03-28 MED ORDER — LIDOCAINE HCL (PF) 1 % IJ SOLN
INTRAMUSCULAR | Status: AC
  Filled 2018-03-28: qty 30

## 2018-03-28 MED ORDER — IBUPROFEN 600 MG PO TABS
600.0000 mg | ORAL_TABLET | Freq: Four times a day (QID) | ORAL | Status: DC
  Administered 2018-03-28: 600 mg via ORAL
  Filled 2018-03-28: qty 1

## 2018-03-28 NOTE — Progress Notes (Signed)
Pt seen and delivery/postpartum discussed. Pt has expressed desire for permanent sterility, this pregnancy and again today Plan PP BTL in am The patient has been fully informed about all methods of contraception, both temporary and permanent. She understands that tubal ligation is meant to be permanent, absolute and irreversible. She was told that there is an approximately 1 in 400 chance of a pregnancy in the future after tubal ligation. She was told the short and long term complications of tubal ligation. She understands the risks from this surgery include, but are not limited to, the risks of anesthesia, hemorrhage, infection, perforation, and injury to adjacent structures, bowel, bladder and blood vessels.   Annamarie Major, MD, [facility], [facility] 03/28/2018  8:03 AM

## 2018-03-28 NOTE — H&P (Signed)
OB History & Physical   History of Present Illness:  Chief Complaint: fluid leaking, contractions, vaginal bleeding  HPI:  Lindsay Wagner is a 28 y.o. [phone] female at 31w6ddated by ultrasound.  Her pregnancy has been complicated by obesity BMI greater than 40, history of cervical incompetence, history of preterm delivery, gestational diabetes, cervical cerclage.    She reports contractions that began shortly after fluid leak.   She reports leakage of fluid at about 2 AM this morning.   She reports vaginal bleeding at the same time as the fluid leak.   She reports fetal movement.    She reports her blood sugars have all been in normal range. She is GDMA1, blood sugar well controlled with diet and exercise.  Maternal Medical History:   Past Medical History:  Diagnosis Date  . Bronchitis   . Cervical cerclage suture present in second trimester 12/07/2017  . GERD (gastroesophageal reflux disease)    occ-no meds  . Gestational diabetes   . History of preterm delivery     Past Surgical History:  Procedure Laterality Date  . CERVICAL CERCLAGE  11/12/2010  . CERVICAL CERCLAGE N/A 10/13/2017   Procedure: CERCLAGE CERVICAL;  Surgeon: SHomero Fellers MD;  Location: [facility] ORS;  Service: Gynecology;  Laterality: N/A;    No Known Allergies  Prior to Admission medications   Medication Sig Start Date End Date Taking? Authorizing Provider  Blood Glucose Monitoring Suppl (ACCU-CHEK NANO SMARTVIEW) w/Device KIT Use as directed 10/25/17  Yes JWill Bonnet MD  glucose blood (ACCU-CHEK SMARTVIEW) test strip Use as instructed 10/25/17  Yes JWill Bonnet MD  Lancets (ACCU-CHEK SOFT TOUCH) lancets Use as instructed 10/25/17  Yes JWill Bonnet MD  Prenatal Vit-Fe Fumarate-FA (PRENATAL MULTIVITAMIN) TABS tablet Take 2 tablets by mouth daily at 12 noon.    Yes [provider]  aspirin EC 81 MG tablet Take 1 tablet (81 mg total) by mouth daily. Take after 12 weeks for prevention  of preeclampssia later in pregnancy Patient not taking: Reported on 03/28/2018 11/08/17   SHomero Fellers MD    OB History  Gravida Para Term Preterm AB Living  3 1   1 1 1   SAB TAB Ectopic Multiple Live Births  1       1    # Outcome Date GA Lbr Len/2nd Weight Sex Delivery Anes PTL Lv  3 Current           2 Preterm 04/14/11 [id number] g F Vag-Spont   LIV     Birth Comments: cerclage  1 SAB 05/22/10 1879w0dF    FD     Birth Comments: preterm labor    Prenatal care site: [facility]  Social History: She  reports that she has never smoked. She has never used smokeless tobacco. She reports that she does not drink alcohol or use drugs.  Family History: She does not have a family history of gynecologic cancers  Review of Systems: Negative x 10 systems reviewed except as noted in the HPI.    Physical Exam:  Vital Signs: BP 120/80 (BP Location: Right Arm)   Pulse 91   Temp 98.2 F (36.8 C) (Oral)   Resp 20   Ht 5' 4"  (1.626 m)   Wt 106.1 kg   LMP 07/25/2017 (Approximate)   BMI 40.17 kg/m  Constitutional: Well nourished, well developed female in no acute distress.  HEENT: normal Skin: Warm and dry.  Cardiovascular: Regular  rate and rhythm.   Extremity: no edema  Respiratory: Clear to auscultation bilateral. Normal respiratory effort Abdomen: FHT present Back: no CVAT Neuro: DTRs 2+, Cranial nerves grossly intact Psych: Alert and Oriented x3. No memory deficits. Normal mood and affect.  MS: normal gait, normal bilateral lower extremity ROM/strength/stability.  Pelvic exam: (female chaperone present) is not limited by body habitus EGBUS: within normal limits Vagina: within normal limits and with normal mucosa, positive pooling of amniotic fluid, no active bleeding Cervix: sterile speculum exam, unable to visualize cervix through pooling fluid Cerclage removed by Dr Glennon Mac, see his note for details   Pertinent Results:  Prenatal Labs: Blood type/Rh A  positive  Antibody screen negative  Rubella Immune  Varicella Immune    RPR Non-reactive  HBsAg negative  HIV negative  GC negative  Chlamydia negative  Genetic screening Screen negative  1 hour GTT Early 1 hour gtt 148  3 hour GTT Early 3 hour 2/4 values elevated  GBS Negative by PCR on 03/28/18   Baseline FHR: 135 beats/min   Variability: moderate   Accelerations: present   Decelerations: absent Contractions: present frequency: every 2-3 minutes Overall assessment: Reassuring  Bedside Ultrasound:  Number of Fetus: 1  Presentation: vertex  Fluid: not evaluated  Placental Location: not evaluated  Assessment:  Lindsay Wagner is a 28 y.o. [id number] female at 84w6dwith gross rupture of membranes, active labor, cerclage removed.   Plan:  1. Admit to Labor & Delivery  2. CBC, T&S, Clrs, IVF 3. GBS PCR.   4. Fetal well-being: Category I 5. Glucostabilizer order set for GDM 6. Observe for cervical change  JRod Can CNM 03/28/2018 4:54 AM

## 2018-03-28 NOTE — Lactation Note (Signed)
This note was copied from a baby's chart. Lactation Consultation Note  Patient Name: Lindsay Wagner [id number] Date: 03/28/2018 Reason for consult: Initial assessment;Late-preterm 34-36.6wks   Maternal Data Formula Feeding for Exclusion: No Has patient been taught Hand Expression?: Yes Does the patient have breastfeeding experience prior to this delivery?: No  Feeding Feeding Type: Breast Fed Left breast more firm and baby having difficulty getting and sustaining deep latch, also very sleepy, latched easier with shield but needed much stimulation to suck, shield initiated to burn less calories while working to latch and suck.,breast softer after feeding,  More alert on right breast and right breast softer nursed fair with stimulation with shield needed stimulation to maintain sucking, mom able to latch baby after this without shield and baby nursed an additional 5 min with minimal stimulation. LATCH Score Latch: Repeated attempts needed to sustain latch, nipple held in mouth throughout feeding, stimulation needed to elicit sucking reflex.  Audible Swallowing: A few with stimulation  Type of Nipple: Everted at rest and after stimulation  Comfort (Breast/Nipple): Soft / non-tender(left breast firmer than right)  Hold (Positioning): Assistance needed to correctly position infant at breast and maintain latch.  LATCH Score: 7  Interventions Interventions: Breast feeding basics reviewed;Assisted with latch;Skin to skin;Breast massage;Hand express;Adjust position;Support pillows;Position options  Lactation Tools Discussed/Used Tools: Nipple Shields Nipple shield size: 20 WIC Program: Yes   Consult Status Consult Status: Follow-up Date: 03/28/18 Follow-up type: In-patient    Dyann Kief 03/28/2018, 12:35 PM

## 2018-03-28 NOTE — Progress Notes (Signed)
Order has been placed for patient to be NPO after midnight.  Tubal at noon tomorrow with Dr Tiburcio Pea- scheduled with OR.  Tresea Mall, CNM

## 2018-03-28 NOTE — OB Triage Note (Signed)
Pt is a 27y/o G3P1 at 105w6d w/ c/o vaginal bleeding, LOF and ctx. Pt states ctx and LOF occurred at 0211 this morning and has had a small/ scant amount of bleeding. LOF was clear, no foul smell and of moderate amount. Pt states positive fetal movement. Monitors applied and assessing. Initial FHT 145. V/s WNL with slightly elevated BP noted of 140/92.

## 2018-03-28 NOTE — Discharge Summary (Signed)
OB Discharge Summary     Patient Name: Lindsay Wagner DOB: 05-05-1990 MRN: [id number]  Date of admission: 03/28/2018 Delivering provider: Rod Can, CNM  Date of Delivery: 03/28/2018  Date of discharge: 03/30/2018  Admitting diagnosis: Pregnancy - PPROM, Cervical cerclage in place Intrauterine pregnancy: [redacted]w[redacted]d    Secondary diagnosis: Gestational Diabetes diet controlled (A1), precipitous labor and delivery     Discharge diagnosis: Preterm Pregnancy Delivered                                                                                                Post partum procedures: postpartum tubal ligation  Augmentation: n/a  Complications: None  Hospital course:  Onset of Labor With Vaginal Delivery      27y.o. yo [id number] 327w6das admitted in Active Labor on 03/28/2018.  Patient had an uncomplicated labor course as follows:  Membrane Rupture Time/Date: 2:11 AM ,03/28/2018   Patient had delivery of viable female 5:30, 03/28/2018 See delivery note for details of delivery.  Patient had an uncomplicated postpartum course.   She is ambulating and voiding without difficulty. She is tolerating PO intake and her pain is well controlled with PO pain medication.  Patient is discharged home in stable condition on 03/30/2018   Physical exam  Vitals:   03/29/18 1925 03/29/18 2301 03/30/18 0302 03/30/18 0753  BP: 106/68 124/72 113/80 111/70  Pulse: 65 76 (!) 55 67  Resp: 18 20 18 20   Temp: 97.9 F (36.6 C) 99 F (37.2 C) 97.9 F (36.6 C) 97.9 F (36.6 C)  TempSrc: Oral Oral Oral Oral  SpO2: 99% 99% 99% 98%  Weight:      Height:       General: alert, cooperative and no distress Lochia: appropriate Uterine Fundus: firm Incision: Healing well with no significant drainage DVT Evaluation: No evidence of DVT seen on physical exam.  Labs: Lab Results  Component Value Date   WBC 8.0 03/29/2018   HGB 8.4 (L) 03/29/2018   HCT 25.6 (L) 03/29/2018   MCV 89.2 03/29/2018   PLT 250  03/29/2018    Discharge instruction: per After Visit Summary.  Medications:  Allergies as of 03/30/2018   No Known Allergies     Medication List    STOP taking these medications   ACCU-CHEK NANO SMARTVIEW w/Device Kit   accu-chek soft touch lancets   aspirin EC 81 MG tablet   glucose blood test strip Commonly known as:  ACCU-CHEK SMARTVIEW     TAKE these medications   prenatal multivitamin Tabs tablet Take 2 tablets by mouth daily at 12 noon.       Diet: carb modified diet  Activity: Advance as tolerated. Pelvic rest for 6 weeks.   Outpatient follow up: Follow-up Information    GlRod CanCNM. Schedule an appointment as soon as possible for a visit in 6 week(s).   Specialty:  Obstetrics Why:  postpartum follow up and 2 hour gtt Contact information: [address] [place] [phone]           Postpartum contraception: Tubal Ligation Rhogam Given  postpartum: NA Rubella vaccine given postpartum: Rubella Immune Varicella vaccine given postpartum: Varicella Immune TDaP given antepartum or postpartum: antepartum  Newborn Data: Live born female Journey Birth Weight: 2750 g, 6 pounds 1 ounce APGAR: 8, 9  Newborn Delivery   Birth date/time:  03/28/2018 05:30:00 Delivery type:  Vaginal, Spontaneous      Baby Feeding: Breast and formula  Disposition: home with mother  SIGNED:  Rod Can, CNM 03/30/2018 9:48 AM

## 2018-03-28 NOTE — Progress Notes (Signed)
Procedure: removal of McDonald Cerclage.  Called to see patient due to gross rupture of membranes with active labor (called visually 2-3 cm). Op note reviewed. A single McDonald cerclage placed with knot at 12 o'clock during cerclage placement. Sterile speculum exam performed with noted gross rupture of membranes.  Suture noted at 12 o'clock with plastic spacer in place to allow separation of knot of suture and cervical mucosa.  Left side of suture cut with scissors below knot and above spacer.  Remaining knot and suture with applied traction with a Debakey.  The entire cerclage removed intact with spacer removed as well. Manual, digital exam performed with no suture noted. Cervix 3/70/-2, again gross rupture of membranes. Bedside ultrasound performed by me and fetus in cephalic presentation, which was suspected on exam.  No bleeding noted at end of procedure. Patient tolerated procedure well.  Thomasene Mohair, MD, [facility], [facility] 03/28/2018 4:58 AM

## 2018-03-29 ENCOUNTER — Encounter: Payer: Self-pay | Admitting: Anesthesiology

## 2018-03-29 ENCOUNTER — Other Ambulatory Visit: Payer: Self-pay

## 2018-03-29 ENCOUNTER — Inpatient Hospital Stay: Payer: 59 | Admitting: Anesthesiology

## 2018-03-29 ENCOUNTER — Encounter
Admission: EM | Disposition: A | Discharge status: Home / Self Care | Payer: Self-pay | Source: Home / Self Care | Attending: Obstetrics and Gynecology

## 2018-03-29 DIAGNOSIS — Z302 Encounter for sterilization: Secondary | ICD-10-CM

## 2018-03-29 HISTORY — PX: TUBAL LIGATION: SHX77

## 2018-03-29 LAB — CBC
HEMATOCRIT: 25.6 % — AB (ref 36.0–46.0)
Hemoglobin: 8.4 g/dL — ABNORMAL LOW (ref 12.0–15.0)
MCH: 29.3 pg (ref 26.0–34.0)
MCHC: 32.8 g/dL (ref 30.0–36.0)
MCV: 89.2 fL (ref 80.0–100.0)
Platelets: 250 10*3/uL (ref 150–400)
RBC: 2.87 MIL/uL — ABNORMAL LOW (ref 3.87–5.11)
RDW: 13 % (ref 11.5–15.5)
WBC: 8 10*3/uL (ref 4.0–10.5)
nRBC: 0 % (ref 0.0–0.2)

## 2018-03-29 LAB — RPR: RPR Ser Ql: NONREACTIVE

## 2018-03-29 LAB — GLUCOSE, CAPILLARY
GLUCOSE-CAPILLARY: 83 mg/dL (ref 70–99)
Glucose-Capillary: 57 mg/dL — ABNORMAL LOW (ref 70–99)
Glucose-Capillary: 98 mg/dL (ref 70–99)
Glucose-Capillary: 58 mg/dL — ABNORMAL LOW (ref 70–99)
Glucose-Capillary: 129 mg/dL — ABNORMAL HIGH (ref 70–99)

## 2018-03-29 SURGERY — LIGATION, FALLOPIAN TUBE, POSTPARTUM
Anesthesia: General | Laterality: Bilateral

## 2018-03-29 MED ORDER — IBUPROFEN 600 MG PO TABS
600.0000 mg | ORAL_TABLET | Freq: Four times a day (QID) | ORAL | Status: DC
  Administered 2018-03-29 – 2018-03-30 (×2): 600 mg via ORAL
  Filled 2018-03-29 (×2): qty 1

## 2018-03-29 MED ORDER — PROPOFOL 10 MG/ML IV BOLUS
INTRAVENOUS | Status: AC
  Filled 2018-03-29: qty 20

## 2018-03-29 MED ORDER — MIDAZOLAM HCL 2 MG/2ML IJ SOLN
INTRAMUSCULAR | Status: DC | PRN
  Administered 2018-03-29: 2 mg via INTRAVENOUS

## 2018-03-29 MED ORDER — KETOROLAC TROMETHAMINE 30 MG/ML IJ SOLN
INTRAMUSCULAR | Status: DC | PRN
  Administered 2018-03-29: 30 mg via INTRAVENOUS

## 2018-03-29 MED ORDER — LACTATED RINGERS IV SOLN
INTRAVENOUS | Status: DC
  Administered 2018-03-29: 12:00:00 via INTRAVENOUS

## 2018-03-29 MED ORDER — FENTANYL CITRATE (PF) 100 MCG/2ML IJ SOLN
INTRAMUSCULAR | Status: AC
  Filled 2018-03-29: qty 2

## 2018-03-29 MED ORDER — ACETAMINOPHEN 10 MG/ML IV SOLN
INTRAVENOUS | Status: DC | PRN
  Administered 2018-03-29: 1000 mg via INTRAVENOUS

## 2018-03-29 MED ORDER — FENTANYL CITRATE (PF) 100 MCG/2ML IJ SOLN: 25.0000 ug | INTRAMUSCULAR | Status: DC | PRN

## 2018-03-29 MED ORDER — OXYCODONE-ACETAMINOPHEN 5-325 MG PO TABS: 1.0000 | ORAL_TABLET | ORAL | Status: DC | PRN

## 2018-03-29 MED ORDER — GLYCOPYRROLATE 0.2 MG/ML IJ SOLN
INTRAMUSCULAR | Status: DC | PRN
  Administered 2018-03-29: 0.2 mg via INTRAVENOUS

## 2018-03-29 MED ORDER — ROCURONIUM BROMIDE 100 MG/10ML IV SOLN
INTRAVENOUS | Status: DC | PRN
  Administered 2018-03-29: 10 mg via INTRAVENOUS
  Administered 2018-03-29: 40 mg via INTRAVENOUS

## 2018-03-29 MED ORDER — SODIUM CHLORIDE FLUSH 0.9 % IV SOLN
INTRAVENOUS | Status: AC
  Filled 2018-03-29: qty 10

## 2018-03-29 MED ORDER — ACETAMINOPHEN 10 MG/ML IV SOLN
INTRAVENOUS | Status: AC
  Filled 2018-03-29: qty 100

## 2018-03-29 MED ORDER — PROPOFOL 10 MG/ML IV BOLUS
INTRAVENOUS | Status: DC | PRN
  Administered 2018-03-29: 150 mg via INTRAVENOUS

## 2018-03-29 MED ORDER — MIDAZOLAM HCL 2 MG/2ML IJ SOLN
INTRAMUSCULAR | Status: AC
  Filled 2018-03-29: qty 2

## 2018-03-29 MED ORDER — MENTHOL 3 MG MT LOZG
1.0000 | LOZENGE | OROMUCOSAL | Status: DC | PRN
  Administered 2018-03-29 (×2): 3 mg via ORAL
  Filled 2018-03-29: qty 9

## 2018-03-29 MED ORDER — SUCCINYLCHOLINE CHLORIDE 20 MG/ML IJ SOLN
INTRAMUSCULAR | Status: DC | PRN
  Administered 2018-03-29: 120 mg via INTRAVENOUS

## 2018-03-29 MED ORDER — DEXTROSE 50 % IV SOLN
INTRAVENOUS | Status: AC
  Administered 2018-03-29: 25 mL via INTRAVENOUS
  Filled 2018-03-29: qty 50

## 2018-03-29 MED ORDER — DEXTROSE 50 % IV SOLN
0.5000 | Freq: Once | INTRAVENOUS | Status: AC
  Administered 2018-03-29: 25 mL via INTRAVENOUS

## 2018-03-29 MED ORDER — OXYCODONE HCL 5 MG/5ML PO SOLN: 5.0000 mg | Freq: Once | ORAL | Status: DC | PRN

## 2018-03-29 MED ORDER — OXYCODONE HCL 5 MG PO TABS: 5.0000 mg | ORAL_TABLET | Freq: Once | ORAL | Status: DC | PRN

## 2018-03-29 MED ORDER — FERROUS SULFATE 325 (65 FE) MG PO TABS
325.0000 mg | ORAL_TABLET | Freq: Two times a day (BID) | ORAL | Status: DC
  Administered 2018-03-29 – 2018-03-30 (×2): 325 mg via ORAL
  Filled 2018-03-29 (×2): qty 1

## 2018-03-29 MED ORDER — SUGAMMADEX SODIUM 500 MG/5ML IV SOLN
INTRAVENOUS | Status: DC | PRN
  Administered 2018-03-29: 424.4 mg via INTRAVENOUS

## 2018-03-29 MED ORDER — BUPIVACAINE HCL 0.5 % IJ SOLN
INTRAMUSCULAR | Status: DC | PRN
  Administered 2018-03-29: 10 mL

## 2018-03-29 MED ORDER — DEXAMETHASONE SODIUM PHOSPHATE 10 MG/ML IJ SOLN
INTRAMUSCULAR | Status: DC | PRN
  Administered 2018-03-29: 10 mg via INTRAVENOUS

## 2018-03-29 MED ORDER — FENTANYL CITRATE (PF) 100 MCG/2ML IJ SOLN
INTRAMUSCULAR | Status: DC | PRN
  Administered 2018-03-29 (×2): 50 ug via INTRAVENOUS

## 2018-03-29 MED ORDER — LIDOCAINE HCL (CARDIAC) PF 100 MG/5ML IV SOSY
PREFILLED_SYRINGE | INTRAVENOUS | Status: DC | PRN
  Administered 2018-03-29: 100 mg via INTRAVENOUS

## 2018-03-29 MED ORDER — BUPIVACAINE HCL (PF) 0.5 % IJ SOLN
INTRAMUSCULAR | Status: AC
  Filled 2018-03-29: qty 30

## 2018-03-29 SURGICAL SUPPLY — 25 items
CHLORAPREP W/TINT 26ML (MISCELLANEOUS) ×3 IMPLANT
COVER WAND RF STERILE (DRAPES) ×3 IMPLANT
DERMABOND ADVANCED (GAUZE/BANDAGES/DRESSINGS) ×2
DERMABOND ADVANCED .7 DNX12 (GAUZE/BANDAGES/DRESSINGS) ×1 IMPLANT
DRAPE LAPAROTOMY 100X77 ABD (DRAPES) ×3 IMPLANT
DRSG TEGADERM 2-3/8X2-3/4 SM (GAUZE/BANDAGES/DRESSINGS) IMPLANT
DRSG TELFA 4X3 1S NADH ST (GAUZE/BANDAGES/DRESSINGS) IMPLANT
ELECT CAUTERY BLADE 6.4 (BLADE) ×3 IMPLANT
ELECT REM PT RETURN 9FT ADLT (ELECTROSURGICAL) ×3
ELECTRODE REM PT RTRN 9FT ADLT (ELECTROSURGICAL) ×1 IMPLANT
GLOVE BIO SURGEON STRL SZ8 (GLOVE) ×3 IMPLANT
GOWN STRL REUS W/ TWL LRG LVL3 (GOWN DISPOSABLE) ×1 IMPLANT
GOWN STRL REUS W/ TWL XL LVL3 (GOWN DISPOSABLE) ×1 IMPLANT
GOWN STRL REUS W/TWL LRG LVL3 (GOWN DISPOSABLE) ×2
GOWN STRL REUS W/TWL XL LVL3 (GOWN DISPOSABLE) ×2
LABEL OR SOLS (LABEL) ×3 IMPLANT
NEEDLE HYPO 22GX1.5 SAFETY (NEEDLE) ×3 IMPLANT
NS IRRIG 500ML POUR BTL (IV SOLUTION) ×3 IMPLANT
PACK BASIN MINOR ARMC (MISCELLANEOUS) ×3 IMPLANT
STRAP SAFETY 5IN WIDE (MISCELLANEOUS) ×3 IMPLANT
SUT VIC AB 0 SH 27 (SUTURE) ×5 IMPLANT
SUT VIC AB 2-0 UR6 27 (SUTURE) ×3 IMPLANT
SUT VIC AB 4-0 FS2 27 (SUTURE) ×2 IMPLANT
SUT VIC AB 4-0 PS2 18 (SUTURE) IMPLANT
SYR 10ML LL (SYRINGE) ×3 IMPLANT

## 2018-03-29 NOTE — Progress Notes (Signed)
PPD#1 SVD Subjective:  Sitting up in bed, cheerful.  Pain control is good. Voiding without difficulty. Tolerating a regular diet. Ambulating well.  Objective:   Blood pressure 120/81, pulse 84, temperature 98.1 F (36.7 C), temperature source Oral, resp. rate 18, height 5\' 4"  (1.626 m), weight 106.1 kg, last menstrual period 07/25/2017, SpO2 100 %.  General: NAD Pulmonary: no increased work of breathing Abdomen: non-distended, non-tender Uterus:  fundus firm; lochia appropriate Extremities: no edema, no erythema, no tenderness, no signs of DVT  Results for orders placed or performed during the hospital encounter of 03/28/18 (from the past 72 hour(s))  ROM Plus ([facility] only)     Status: None   Collection Time: 03/28/18  3:41 AM  Result Value Ref Range   Rom Plus POSITIVE     Comment: Performed at [facility], [address] [place], [place] [address]  Glucose, capillary     Status: None   Collection Time: 03/28/18  4:14 AM  Result Value Ref Range   Glucose-Capillary 79 70 - 99 mg/dL  Group B strep by PCR     Status: None   Collection Time: 03/28/18  4:30 AM  Result Value Ref Range   Group B strep by PCR NEGATIVE NEGATIVE    Comment: (NOTE) Intrapartum testing with Xpert GBS assay should be used as an adjunct to other methods available and not used to replace antepartum testing (at 35-[redacted] weeks gestation). Performed at [facility], [address] [place], [place] [address]   CBC     Status: Abnormal   Collection Time: 03/28/18  6:35 AM  Result Value Ref Range   WBC 14.0 (H) 4.0 - 10.5 K/uL   RBC 3.51 (L) 3.87 - 5.11 MIL/uL   Hemoglobin 10.0 (L) 12.0 - 15.0 g/dL   HCT 16.0 (L) 10.9 - 32.3 %   MCV 90.9 80.0 - 100.0 fL   MCH 28.5 26.0 - 34.0 pg   MCHC 31.3 30.0 - 36.0 g/dL   RDW 55.7 32.2 - 02.5 %   Platelets 296 150 - 400 K/uL   nRBC 0.0 0.0 - 0.2 %    Comment: Performed at [facility], [address] [place], [place] [address]  Type and  screen [facility]     Status: None   Collection Time: 03/28/18  6:35 AM  Result Value Ref Range   ABO/RH(D) A POS    Antibody Screen NEG    Sample Expiration      03/31/2018 Performed at [facility], [address] [place], [place] [address]   RPR     Status: None   Collection Time: 03/28/18  6:35 AM  Result Value Ref Range   RPR Ser Ql Non Reactive Non Reactive    Comment: (NOTE) Performed At: [facility] [address] [place], [place] [id number] Jolene Schimke MD WV:[id number]   CBC     Status: Abnormal   Collection Time: 03/29/18  6:01 AM  Result Value Ref Range   WBC 8.0 4.0 - 10.5 K/uL   RBC 2.87 (L) 3.87 - 5.11 MIL/uL   Hemoglobin 8.4 (L) 12.0 - 15.0 g/dL   HCT 54.6 (L) 27.0 - 35.0 %   MCV 89.2 80.0 - 100.0 fL   MCH 29.3 26.0 - 34.0 pg   MCHC 32.8 30.0 - 36.0 g/dL   RDW 09.3 81.8 - 29.9 %   Platelets 250 150 - 400 K/uL   nRBC 0.0 0.0 - 0.2 %  Comment: Performed at [facility], [address] [place], [place] [address]    Assessment:   28 y.o. [phone] postpartum day # 1 in good condition.  Plan:   1) Acute blood loss anemia - hemodynamically stable and asymptomatic - PO ferrous sulfate and continue prenatal vitamins with iron  2) Blood Type --/--/A POS (01/07 5929)   3) Rubella 1.81 (06/18 1039) / Varicella Immune / TDAP status: received antepartum 02/15/2018  4) Breast and formula feeding  5) Contraception: Plans BTL, scheduled for 1145 today.  6) Disposition: continue postpartum care.  [name], CNM 03/29/2018

## 2018-03-29 NOTE — Lactation Note (Signed)
This note was copied from a baby's chart. Lactation Consultation Note  Patient Name: Girl Devonie Badeaux [id number] Date: 03/29/2018 Reason for consult: Initial assessment   Maternal Data    Feeding Feeding Type: Breast Fed  LATCH Score Latch: Grasps breast easily, tongue down, lips flanged, rhythmical sucking.  Audible Swallowing: A few with stimulation  Type of Nipple: Everted at rest and after stimulation  Comfort (Breast/Nipple): Soft / non-tender  Hold (Positioning): Assistance needed to correctly position infant at breast and maintain latch.  LATCH Score: 8  Interventions Interventions: Assisted with latch;Breast compression;Support pillows;Position options  Lactation Tools Discussed/Used     Consult Status Consult Status: Follow-up Date: 03/29/18 Follow-up type: In-patient  Mother states that infant is having trouble latching on the left breast but is easily latches to the right so mother has mostly breastfed her on the right side. Mother states that left nipple is a little flatter than the right. LC assisted with latch on the left side in the football hold. Mother was demonstrated hand expression and how to sandwich the breast to achieve a deep latch. Infant was able to easily latch using these techniques. Mother denies any pain or discomfort during breastfeeding.   Arlyss Gandy 03/29/2018, 3:37 PM

## 2018-03-29 NOTE — Progress Notes (Signed)
CBG 57, informed Dr. Randa Ngo.  Admin 25 ml D50 per order.

## 2018-03-29 NOTE — Anesthesia Post-op Follow-up Note (Signed)
Anesthesia QCDR form completed.        

## 2018-03-29 NOTE — Op Note (Signed)
Operative Note  03/29/2018  PRE-OP DIAGNOSIS: Desire for sterility  POST-OP DIAGNOSIS: same  SURGEON: Annamarie Major, MD, FACOG  PROCEDURE: Postpartum Bilateral Tubal Ligation Procedure   ANESTHESIA: Choice   ESTIMATED BLOOD LOSS: Min  SPECIMENS: Portion of left and right tube  COMPLICATIONS: None  DISPOSITION: PACU - hemodynamically stable.  CONDITION: stable  FINDINGS: Exam under anesthesia revealed small, mobile  uterus with no masses and bilateral adnexa without masses or fullness.   TECHNIQUE:  Patient is prepped and draped in usual sterile fashion after adequate anesthesia is obtained in the supine position on the operating room table.  Local anesthesia is injected into the skin just inferior to the umbilicus, followed by a small elliptical incision with a scapel.  Fascia is identified and tented upwards, and an incision is made with Mayo scissors.  Identification of no adherent bowel is made. Retractors are placed and trendelenburg positioning is achieved.    The left Fallopian tube was identified, grasped with the Babcock clamps, lifted to the skin incision and followed out distally to the fimbriae. An avascular midsection of the tube approximately 3-4cm from the cornua was grasped with the babcock clamps and brought into a knuckle at the skin incision. The tube was double ligated with 2-0 Vicryl suture and the intervening portion of tube was transected and removed. Excellent hemostasis was noted and the tube was returned to the abdomen. Attention was then turned to the right fallopian tube after confirmation of identification by tracing the tube out to the fimbriae. The same procedure was then performed on the right Fallopian tube. Again, excellent hemostasis was noted at the end of the procedure.  Retractors are removed and fascia closed with a 2-0 Vicryl suture. Irrigation and hemostasis confirmed.  Skin closed with a 4-0 vicryl suture in a subcuticular fashion followed by skin  adhesive.  Pt goes to recovery room in stable fashion.  All counts correct times 2.  Annamarie Major, MD, [facility], [facility] 03/29/2018  12:50 PM

## 2018-03-29 NOTE — Progress Notes (Signed)
Rechecked CBG -98

## 2018-03-29 NOTE — Progress Notes (Signed)
Pt completed infant CPR video and was able to demonstrate with teachback

## 2018-03-29 NOTE — Plan of Care (Signed)
Vs stable; up ad lib; NPO now due to surgery today at 1145; pt having BTL; pt knows to remove jewelry before going for surgery; pt knows to call for RN the next time she needs to void so we can do the CHG wipes and put a new gown on her; consent signed and 30 medicaid paper in pt's chart; breast and formula feeding baby; has only needed motrin for pain control this shift

## 2018-03-29 NOTE — Anesthesia Preprocedure Evaluation (Signed)
Anesthesia Evaluation  Patient identified by MRN, date of birth, ID band Patient awake    Reviewed: Allergy & Precautions, H&P , NPO status , Patient's Chart, lab work & pertinent test results  History of Anesthesia Complications Negative for: history of anesthetic complications  Airway Mallampati: III  TM Distance: >3 FB Neck ROM: full    Dental  (+) Chipped   Pulmonary neg pulmonary ROS, neg shortness of breath,           Cardiovascular Exercise Tolerance: Good (-) angina(-) Past MI and (-) DOE negative cardio ROS       Neuro/Psych negative neurological ROS  negative psych ROS   GI/Hepatic Neg liver ROS, GERD  Medicated and Controlled,  Endo/Other  diabetes, Gestational  Renal/GU      Musculoskeletal   Abdominal   Peds  Hematology negative hematology ROS (+)   Anesthesia Other Findings Past Medical History: No date: Bronchitis 12/07/2017: Cervical cerclage suture present in second trimester No date: GERD (gastroesophageal reflux disease)     Comment:  occ-no meds No date: Gestational diabetes No date: History of preterm delivery  Past Surgical History: 11/12/2010: CERVICAL CERCLAGE 10/13/2017: CERVICAL CERCLAGE; N/A     Comment:  Procedure: CERCLAGE CERVICAL;  Surgeon: Natale Milch, MD;  Location: [facility] ORS;  Service:               Gynecology;  Laterality: N/A;  BMI    Body Mass Index:  40.17 kg/m      Reproductive/Obstetrics (+) Breast feeding                              Anesthesia Physical Anesthesia Plan  ASA: III  Anesthesia Plan: General ETT   Post-op Pain Management:    Induction: Intravenous  PONV Risk Score and Plan: Ondansetron, Dexamethasone, Midazolam and Treatment may vary due to age or medical condition  Airway Management Planned: Oral ETT  Additional Equipment:   Intra-op Plan:   Post-operative Plan: Extubation in  OR  Informed Consent: I have reviewed the patients History and Physical, chart, labs and discussed the procedure including the risks, benefits and alternatives for the proposed anesthesia with the patient or authorized representative who has indicated his/her understanding and acceptance.   Dental Advisory Given  Plan Discussed with: Anesthesiologist, CRNA and Surgeon  Anesthesia Plan Comments: (Patient consented for risks of anesthesia including but not limited to:  - adverse reactions to medications - damage to teeth, lips or other oral mucosa - sore throat or hoarseness - Damage to heart, brain, lungs or loss of life  Patient voiced understanding.)        Anesthesia Quick Evaluation

## 2018-03-29 NOTE — Transfer of Care (Signed)
Immediate Anesthesia Transfer of Care Note  Patient: Lindsay Wagner  Procedure(s) Performed: POST PARTUM TUBAL LIGATION (Bilateral )  Patient Location: PACU  Anesthesia Type:General  Level of Consciousness: awake, alert  and oriented  Airway & Oxygen Therapy: Patient Spontanous Breathing and Patient connected to face mask oxygen  Post-op Assessment: Report given to RN and Post -op Vital signs reviewed and stable  Post vital signs: Reviewed and stable  Last Vitals:  Vitals Value Taken Time  BP    Temp    Pulse 123 03/29/2018 12:53 PM  Resp 13 03/29/2018 12:53 PM  SpO2 100 % 03/29/2018 12:53 PM  Vitals shown include unvalidated device data.  Last Pain:  Vitals:   03/29/18 1135  TempSrc: Oral  PainSc:          Complications: No apparent anesthesia complications

## 2018-03-29 NOTE — Anesthesia Postprocedure Evaluation (Signed)
Anesthesia Post Note  Patient: Lindsay Wagner  Procedure(s) Performed: POST PARTUM TUBAL LIGATION (Bilateral )  Patient location during evaluation: PACU Anesthesia Type: General Level of consciousness: awake and alert Pain management: pain level controlled Vital Signs Assessment: post-procedure vital signs reviewed and stable Respiratory status: spontaneous breathing, nonlabored ventilation, respiratory function stable and patient connected to nasal cannula oxygen Cardiovascular status: blood pressure returned to baseline and stable Postop Assessment: no apparent nausea or vomiting Anesthetic complications: no     Last Vitals:  Vitals:   03/29/18 1350 03/29/18 1406  BP: 117/84 116/83  Pulse: 63 67  Resp: 16 16  Temp: (!) 36.4 C 36.7 C  SpO2: 100% 100%    Last Pain:  Vitals:   03/29/18 1416  TempSrc:   PainSc: 0-No pain                 Cleda Mccreedy Piscitello

## 2018-03-29 NOTE — Anesthesia Procedure Notes (Signed)
Procedure Name: Intubation Date/Time: 03/29/2018 12:29 PM Performed by: Nelda Marseille, CRNA Pre-anesthesia Checklist: Patient identified, Patient being monitored, Timeout performed, Emergency Drugs available and Suction available Patient Re-evaluated:Patient Re-evaluated prior to induction Oxygen Delivery Method: Circle system utilized Preoxygenation: Pre-oxygenation with 100% oxygen Induction Type: IV induction Ventilation: Mask ventilation without difficulty Laryngoscope Size: Mac, 3 and McGraph Grade View: Grade II Tube type: Oral Tube size: 7.0 mm Number of attempts: 1 Airway Equipment and Method: Stylet Placement Confirmation: ETT inserted through vocal cords under direct vision,  positive ETCO2 and breath sounds checked- equal and bilateral Secured at: 21 cm Tube secured with: Tape Dental Injury: Teeth and Oropharynx as per pre-operative assessment  Difficulty Due To: Difficult Airway- due to large tongue and Difficult Airway- due to anterior larynx

## 2018-03-30 ENCOUNTER — Other Ambulatory Visit: Payer: 59

## 2018-03-30 ENCOUNTER — Encounter: Payer: 59 | Admitting: Obstetrics and Gynecology

## 2018-03-30 ENCOUNTER — Encounter: Payer: Self-pay | Admitting: Obstetrics & Gynecology

## 2018-03-30 DIAGNOSIS — Z9851 Tubal ligation status: Secondary | ICD-10-CM

## 2018-03-30 LAB — SURGICAL PATHOLOGY

## 2018-03-30 NOTE — Progress Notes (Signed)
Provided and reviewed discharge paperwork. Verified understanding by use of teach back method, pt verbalized understanding as well. Follow up appointment provided. Mother discharged home with infant, taken to visitor entrance via wheel chair.

## 2018-03-30 NOTE — Lactation Note (Signed)
This note was copied from a baby's chart. Lactation Consultation Note  Patient Name: Lindsay Wagner [id number] Date: 03/30/2018     Maternal Data    Feeding Feeding Type: Bottle Fed - Formula Nipple Type: Slow - flow  LATCH Score Latch: Grasps breast easily, tongue down, lips flanged, rhythmical sucking.  Audible Swallowing: Spontaneous and intermittent  Type of Nipple: Everted at rest and after stimulation  Comfort (Breast/Nipple): Soft / non-tender  Hold (Positioning): No assistance needed to correctly position infant at breast.  LATCH Score: 10  Interventions    Lactation Tools Discussed/Used     Consult Status  LC talked with mother before her discharge about any lactation concerns. Mother states that infant is breastfeeding better especially on the left side which was initially difficult for her to latch on to. LC gave mother information on the lactation outpatient clinic and the moms express breastfeeding support group for additional support after discharge. Mother has questions about when to start pumping. LC advised mother to start pumping about 3 weeks before returning to work.    Arlyss Gandy 03/30/2018, 10:57 AM

## 2018-05-12 ENCOUNTER — Ambulatory Visit: Payer: 59 | Admitting: Advanced Practice Midwife

## 2018-05-12 ENCOUNTER — Other Ambulatory Visit: Payer: 59

## 2018-05-19 ENCOUNTER — Other Ambulatory Visit (HOSPITAL_COMMUNITY)
Admission: RE | Admit: 2018-05-19 | Discharge: 2018-05-19 | Disposition: A | Discharge status: Home / Self Care | Payer: 59 | Source: Ambulatory Visit | Attending: Advanced Practice Midwife | Admitting: Advanced Practice Midwife

## 2018-05-19 ENCOUNTER — Encounter: Payer: Self-pay | Admitting: Advanced Practice Midwife

## 2018-05-19 ENCOUNTER — Ambulatory Visit (INDEPENDENT_AMBULATORY_CARE_PROVIDER_SITE_OTHER): Payer: 59 | Admitting: Advanced Practice Midwife

## 2018-05-19 ENCOUNTER — Other Ambulatory Visit: Payer: 59

## 2018-05-19 DIAGNOSIS — Z124 Encounter for screening for malignant neoplasm of cervix: Secondary | ICD-10-CM

## 2018-05-19 DIAGNOSIS — Z131 Encounter for screening for diabetes mellitus: Secondary | ICD-10-CM

## 2018-05-19 NOTE — Progress Notes (Addendum)
Postpartum Visit  Chief Complaint:  Chief Complaint  Patient presents with  . Postpartum Care    History of Present Illness: Patient is a 28 y.o. [id number] presents for postpartum visit.  Review the Delivery Report for details.  Delivery Note At 5:30 AM a viable female was delivered via Vaginal, Spontaneous (Presentation: OA; LOA).  APGAR: 8, 9; weight 6 lb 1 oz (2750 g).   Placenta status: spontaneous, intact.  Cord:  with the following complications: none.  Cord pH: NA  Anesthesia: none  Episiotomy: None Lacerations: None Suture Repair: NA Est. Blood Loss (mL): 450  Mom to postpartum.  Baby to Couplet care / Skin to Skin.  Tresea Mall, CNM 05/19/2018, 11:37 AM   Date of delivery: 03/28/2018  Type of delivery: Vaginal delivery - Vacuum or forceps assisted  no Episiotomy No.  Laceration: no  Pregnancy or labor problems:  Gestational Diabetes, Cerclage at 12 weeks Any problems since the delivery: none  Newborn Details:  SINGLETON :  1. BabyGender female. Journey Birth weight: 2750 g Maternal Details:  Breast or formula feeding: breastfeeding and supplementing with formula Intercourse: No  Contraception after delivery: Bilateral Tubal Ligation Any bowel or bladder issues: No  Post partum depression/anxiety noted:  no Edinburgh Post-Partum Depression Score: 2 Date of last PAP: September 2017  no abnormalities   Review of Systems: Review of Systems  Constitutional: Negative.   HENT: Negative.   Eyes: Negative.   Respiratory: Negative.   Cardiovascular: Negative.   Gastrointestinal: Negative.   Genitourinary: Negative.   Musculoskeletal: Negative.   Skin: Negative.   Neurological: Negative.   Endo/Heme/Allergies: Negative.   Psychiatric/Behavioral: Negative.      Past Medical History:  Past Medical History:  Diagnosis Date  . Bronchitis   . Cervical cerclage suture present in second trimester 12/07/2017  . GERD (gastroesophageal reflux disease)    occ-no meds  . Gestational diabetes   . History of preterm delivery     Past Surgical History:  Past Surgical History:  Procedure Laterality Date  . CERVICAL CERCLAGE  11/12/2010  . CERVICAL CERCLAGE N/A 10/13/2017   Procedure: CERCLAGE CERVICAL;  Surgeon: Natale Milch, MD;  Location: [facility] ORS;  Service: Gynecology;  Laterality: N/A;  . TUBAL LIGATION Bilateral 03/29/2018   Procedure: POST PARTUM TUBAL LIGATION;  Surgeon: Nadara Mustard, MD;  Location: [facility] ORS;  Service: Gynecology;  Laterality: Bilateral;    Family History:  Family History  Problem Relation Age of Onset  . Cancer Neg Hx   . Diabetes Neg Hx   . Hypertension Neg Hx   . Stroke Neg Hx   . Thyroid disease Neg Hx     Social History:  Social History   Socioeconomic History  . Marital status: Married    Spouse name: Christiane Ha  . Number of children: 1  . Years of education: Not on file  . Highest education level: Not on file  Occupational History  . Not on file  Social Needs  . Financial resource strain: Not on file  . Food insecurity:    Worry: Not on file    Inability: Not on file  . Transportation needs:    Medical: Not on file    Non-medical: Not on file  Tobacco Use  . Smoking status: Never Smoker  . Smokeless tobacco: Never Used  Substance and Sexual Activity  . Alcohol use: Never    Frequency: Never  . Drug use: Never  . Sexual activity: Yes  Birth control/protection: None, Surgical    Comment: BTL   Lifestyle  . Physical activity:    Days per week: 0 days    Minutes per session: 0 min  . Stress: Not at all  Relationships  . Social connections:    Talks on phone: Not on file    Gets together: Not on file    Attends religious service: Not on file    Active member of club or organization: Not on file    Attends meetings of clubs or organizations: Not on file    Relationship status: Not on file  . Intimate partner violence:    Fear of current or ex partner: Not on file     Emotionally abused: Not on file    Physically abused: Not on file    Forced sexual activity: Not on file  Other Topics Concern  . Not on file  Social History Narrative  . Not on file    Allergies:  No Known Allergies  Medications: Prior to Admission medications   Medication Sig Start Date End Date Taking? Authorizing Provider  Prenatal Vit-Fe Fumarate-FA (PRENATAL MULTIVITAMIN) TABS tablet Take 2 tablets by mouth daily at 12 noon.     [provider]    Physical Exam Blood pressure 124/74, height 5\' 4"  (1.626 m), last menstrual period 07/25/2017.    General: NAD HEENT: normocephalic, anicteric Pulmonary: No increased work of breathing Abdomen: NABS, soft, non-tender, non-distended.  Umbilicus without lesions.  No hepatomegaly, splenomegaly or masses palpable. No evidence of hernia. Genitourinary:  External: Normal external female genitalia.  Normal urethral meatus, normal  Bartholin's and Skene's glands.    Vagina: Normal vaginal mucosa, no evidence of prolapse.    Cervix: Grossly normal in appearance, currently on menses, no CMT  Uterus: Non-enlarged, mobile, normal contour.    Adnexa: ovaries non-enlarged, no adnexal masses  Rectal: deferred Extremities: no edema, erythema, or tenderness Neurologic: Grossly intact Psychiatric: mood appropriate, affect full    Assessment: 28 y.o. [id number] presenting for 6 week postpartum visit presenting for 6 week postpartum visit  Plan: Problem List Items Addressed This Visit    None    Visit Diagnoses    6 weeks postpartum follow-up    -  Primary   Relevant Orders   Cytology - PAP   Screening for diabetes mellitus       Relevant Orders   Glucose tolerance, 2 hours   Cervical cancer screening       Relevant Orders   Cytology - PAP       1) Contraception - Education given regarding options for contraception, as well as compatibility with breast feeding if applicable.  Patient plans on tubal ligation for contraception.  2)  Pap today - ASCCP  guidelines and rationale discussed.  Patient opts for every 3 years screening interval  3) Patient underwent screening for postpartum depression with no signs of depression   4) Continue breastfeeding: recommended to 6 months exclusive and continued to 1 year with food supplementation   5) Return in about 1 year (around 05/20/2019) for annual established gyn.   Tresea Mall, CNM [facility], [facility] 05/19/2018, 11:37 AM

## 2018-05-22 LAB — CYTOLOGY - PAP: Diagnosis: NEGATIVE

## 2018-05-22 LAB — GLUCOSE TOLERANCE, 2 HOURS
Glucose, 2 hour: 58 mg/dL — ABNORMAL LOW (ref 65–139)
Glucose, GTT - Fasting: 66 mg/dL (ref 65–99)

## 2018-07-19 ENCOUNTER — Encounter (HOSPITAL_COMMUNITY): Payer: Self-pay

## 2019-02-20 IMAGING — US US MFM OB DETAIL+14 WK
1 series · 12 of 28 positions shown · non-contrast
Comparison: none

PATIENT INFO:

PERFORMED BY:
                   UGO CNM
SERVICE(S) PROVIDED:
 ----------------------------------------------------------------------
INDICATIONS:
  27 weeks gestation of pregnancy
FETAL EVALUATION:
 Num Of Fetuses:         1
 Fetal Heart Rate(bpm):  -153
 Cardiac Activity:       Present
 Presentation:           Vertex
 Placenta:               Posterior Grade , No previa
 AFI Sum(cm)     %Tile       Largest Pocket(cm)
 13.28           39
 RUQ(cm)       RLQ(cm)       LUQ(cm)        LLQ(cm)
BIOMETRY:
 BPD:        72  mm     G. Age:  28w 6d         76  %    CI:        77.77   %    70 - 86
                                                         FL/HC:      19.6   %    18.8 -
 HC:      258.4  mm     G. Age:  28w 1d         32  %    HC/AC:      1.10        1.05 -
 AC:       235   mm     G. Age:  27w 6d         46  %    FL/BPD:     70.3   %    71 - 87
 FL:       50.6  mm     G. Age:  27w 1d         21  %    FL/AC:      21.5   %    20 - 24
 HUM:      46.5  mm     G. Age:  27w 3d         40  %
 CM:        6.2  mm
 Est. FW:    0003  gm      2 lb 7 oz     39  %
GESTATIONAL AGE:
 U/S Today:     28w 0d                                        EDD:   04/24/18
 Best:          27w 5d     Det. By:  Early Ultrasound         EDD:   04/26/18
                                     (09/13/17)
ANATOMY:
 Cranium:               Within Normal Limits   Aortic Arch:            Normal appearance
 Cavum:                 CSP visualized         Ductal Arch:            Normal appearance
 Ventricles:            Normal appearance      Diaphragm:              Within Normal Limits
 Choroid Plexus:        Within Normal Limits   Stomach:                Seen
 Cerebellum:            Within Normal Limits   Abdomen:                Within Normal
                                                                       Limits
 Posterior Fossa:       Within Normal Limits   Abdominal Wall:         Normal appearance
 Face:                  Orbits visualized      Cord Vessels:           3 vessels
 Lips:                  Normal appearance      Kidneys:                Normal appearance
 Thoracic:              Within Normal Limits   Bladder:                Seen
 Heart:                 4-Chamber view         Spine:                  Normal appearance
                        appears normal
 RVOT:                  Normal appearance      Upper Extremities:      Visualized
 LVOT:                  Normal appearance      Lower Extremities:      Visualized
CERVIX UTERUS ADNEXA:
 Cervix
 Length:           3.86  cm.

[Series 1: us mfm ob detail+14 wk · 0.23mm/px · 12 of 81 slices shown]
[im 3/81]
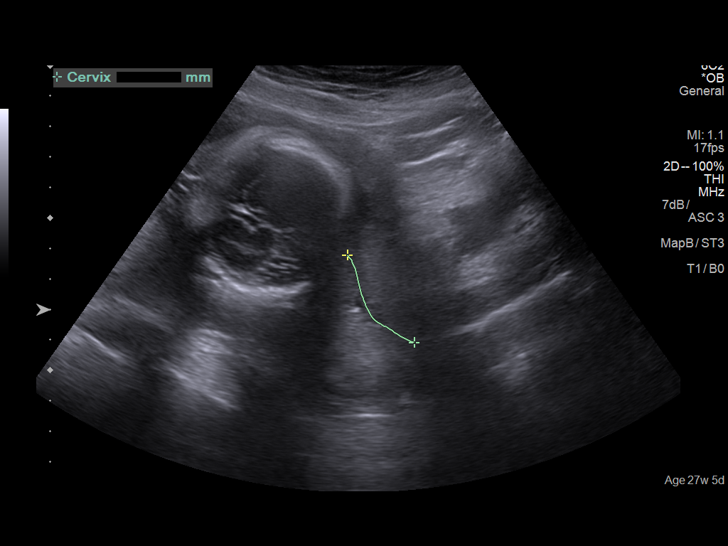
[im 9/81]
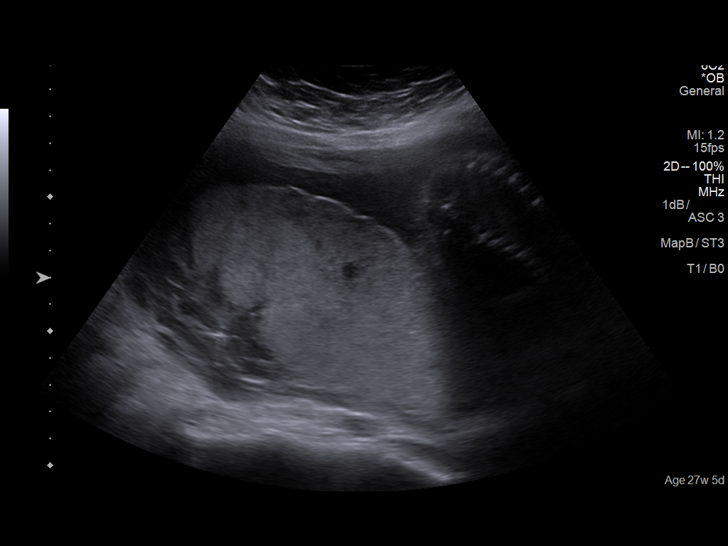
[im 15/81]
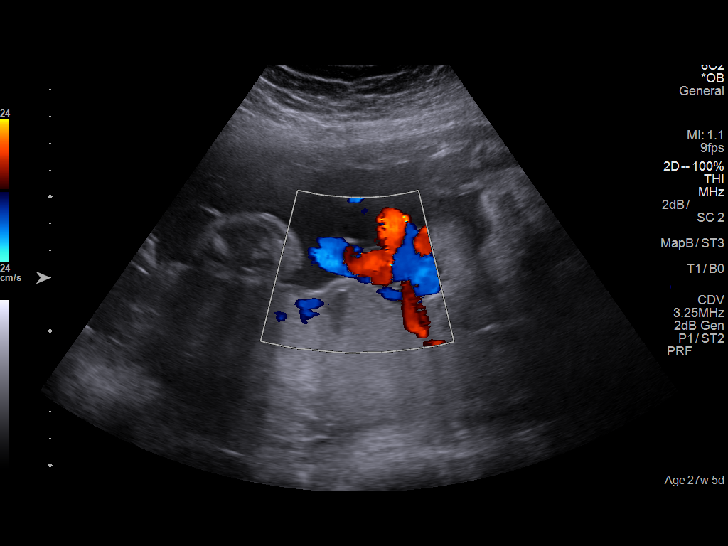
[im 24/81]
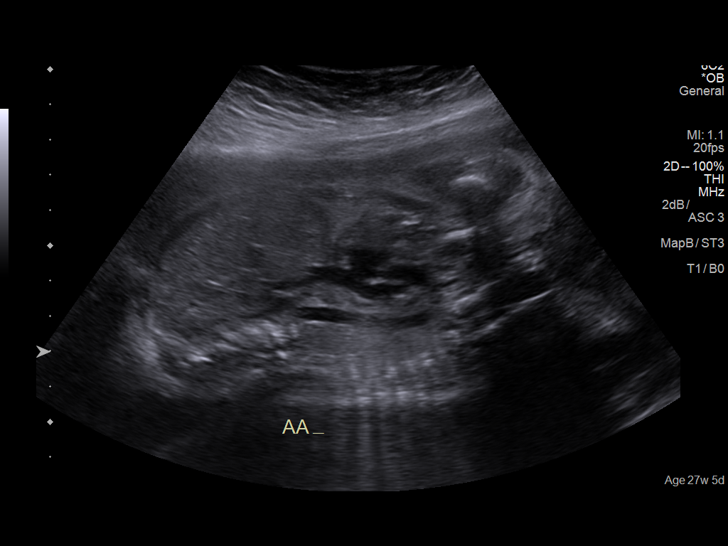
[im 30/81]
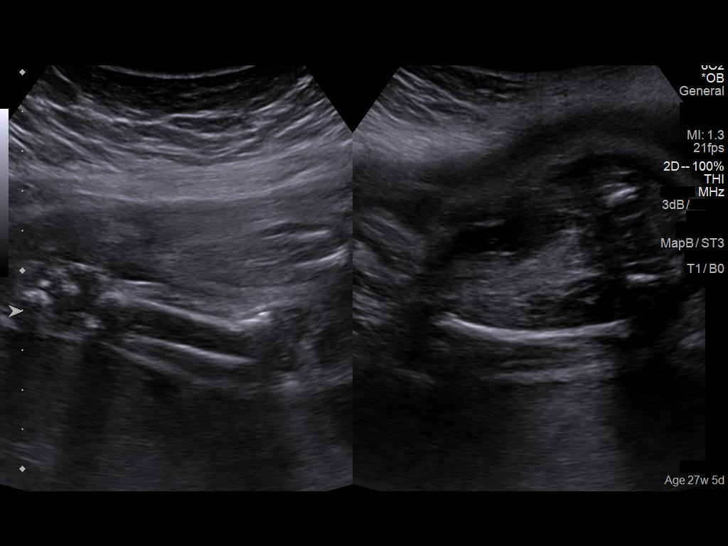
[im 36/81]
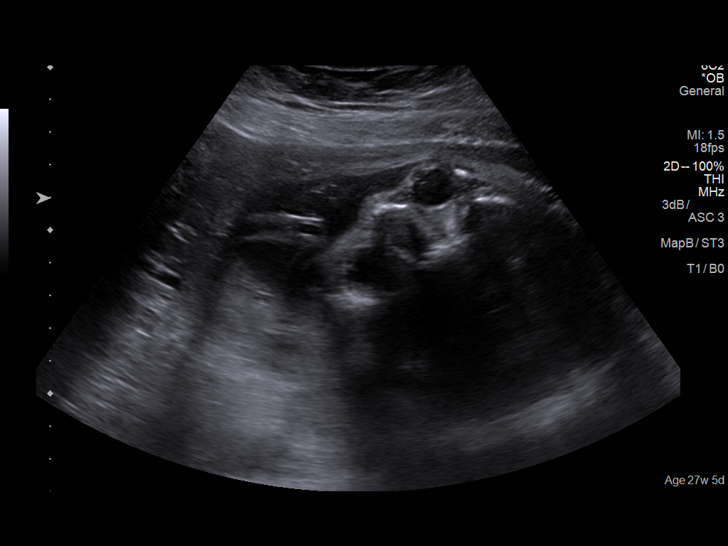
[im 45/81]
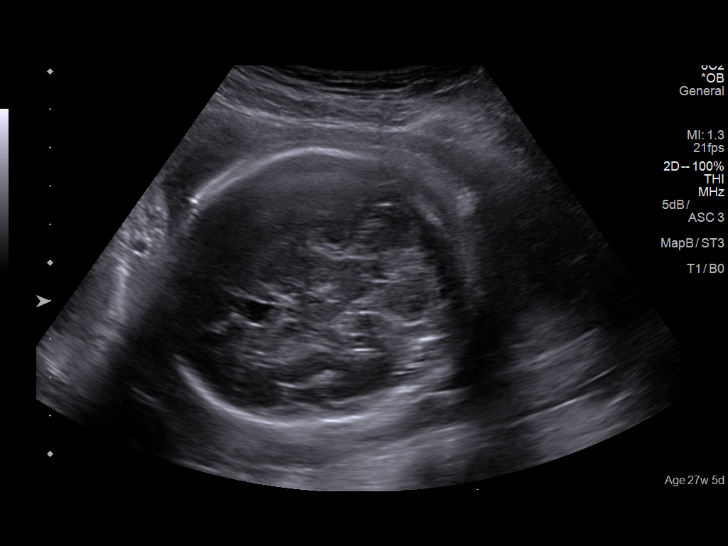
[im 51/81]
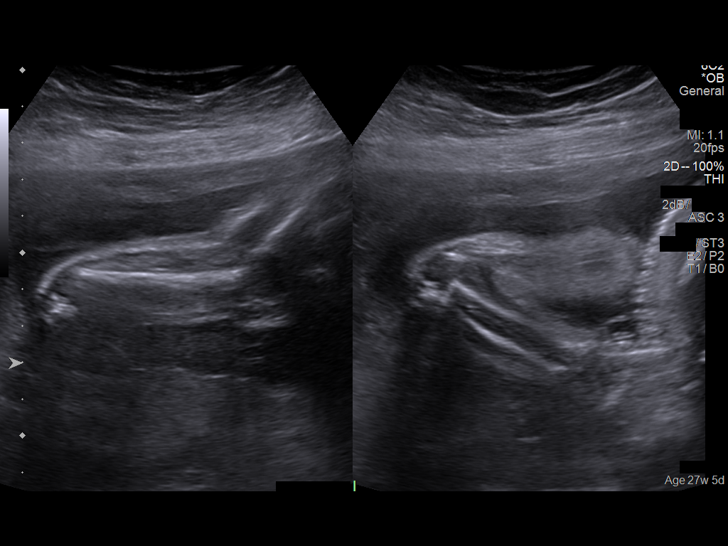
[im 57/81]
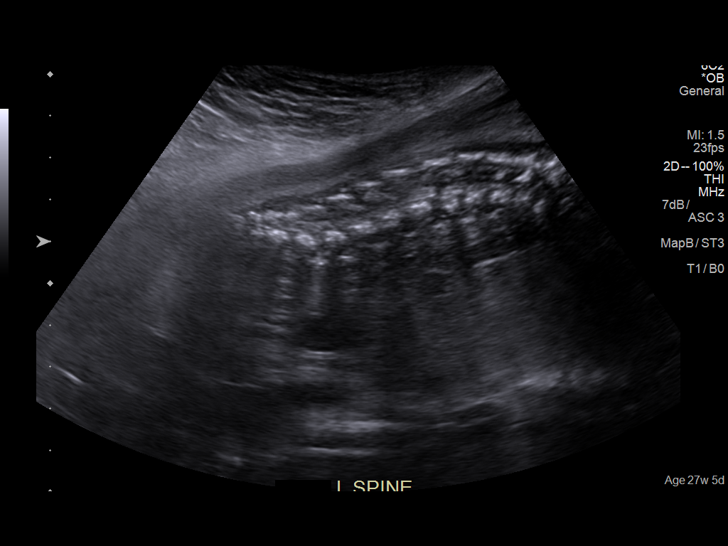
[im 66/81]
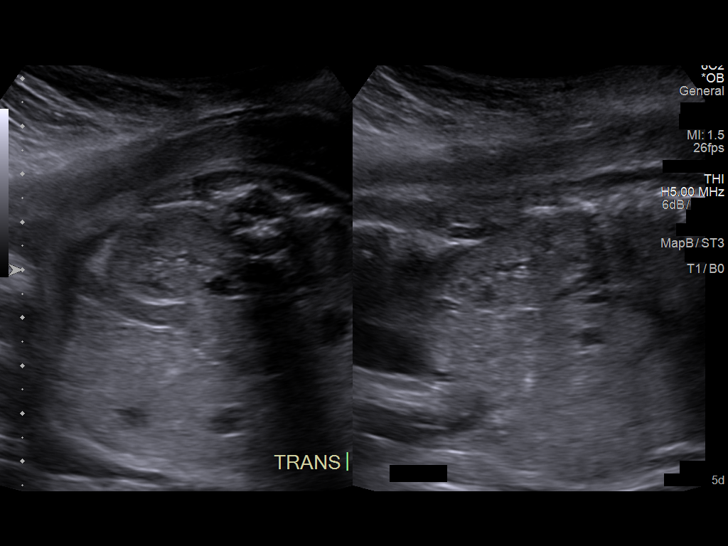
[im 72/81]
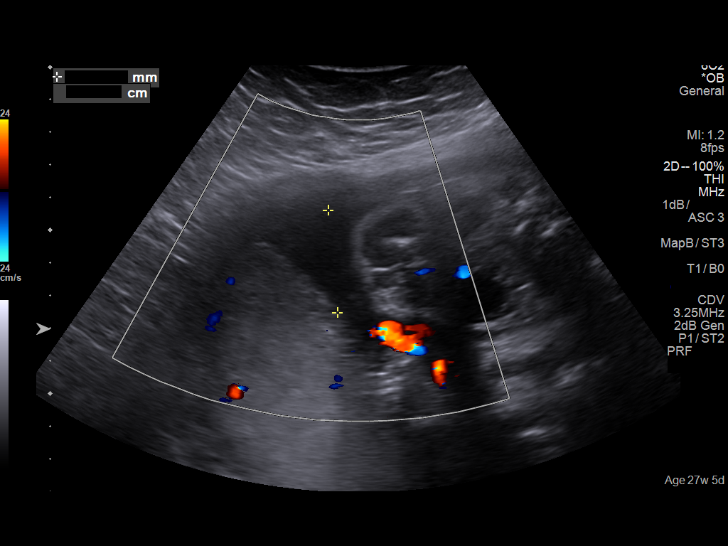
[im 78/81]
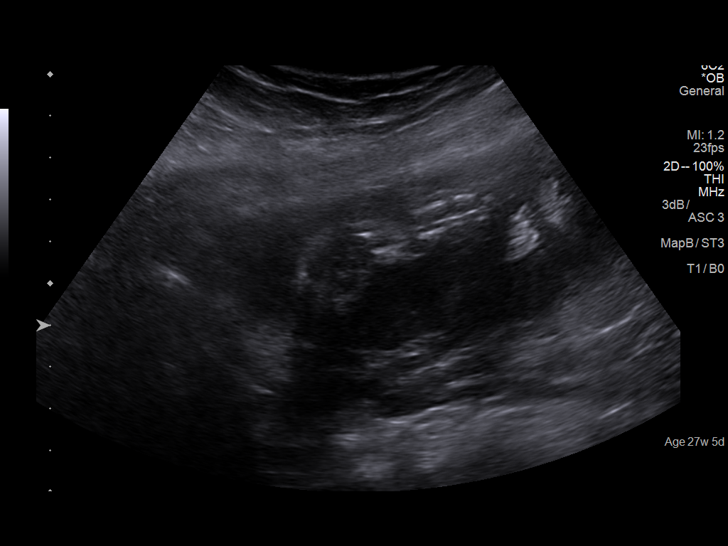

[12 of 28 positions shown; findings below may reference images not displayed]

IMPRESSION: Dear Ms.   UGO,

 Thank you for referring your patient to [facility] for
 evaluation of fetal arrhythmia and for recommendations
 regarding management of her diabetes.

 There is a singleton gestation at 05w5d gestation by 7w6d
 US performed at [facility] on 09/13/2017.

 The fetal biometry correlates with established dating.

 Detailed evaluation of the fetal anatomy was performed.  The
 fetal anatomical survey appears within normal limits.
 Specifically, fetal cardiac anatomy is normal.  Furthermore,
 no irregularities of the fetal heart rate were observed at any
 time during the US today.

RECOMMENDATIONS:

 A full perinatal consult will follow.

 The patient was scheduled to return in 4 weeks for evaluation
 of fetal growth and verification of normal fetal heart rhythm.

## 2019-03-20 IMAGING — US US MFM OB FOLLOW-UP
1 series · 12 of 28 positions shown · non-contrast
Comparison: none

PATIENT INFO:

PERFORMED BY:
                   Sonographer
                   SAMSONAITE CNM
SERVICE(S) PROVIDED:
 ----------------------------------------------------------------------
INDICATIONS:
  31 weeks gestation of pregnancy
FETAL EVALUATION:
 Num Of Fetuses:         1
 Fetal Heart Rate(bpm):  145
 Presentation:           Cephalic
 Placenta:               Posterior
 AFI Sum(cm)     %Tile       Largest Pocket(cm)
 8.31            4
               RLQ(cm)       LUQ(cm)        LLQ(cm)
BIOMETRY:
 BPD:      79.3  mm     G. Age:  31w 6d         44  %    CI:        75.87   %    70 - 86
                                                         FL/HC:      21.2   %    19.1 -
 HC:      288.6  mm     G. Age:  31w 5d         17  %    HC/AC:      1.06        0.96 -
 AC:      271.1  mm     G. Age:  31w 1d         33  %    FL/BPD:     77.0   %    71 - 87
 FL:       61.1  mm     G. Age:  31w 5d         38  %    FL/AC:      22.5   %    20 - 24
 HUM:      53.8  mm     G. Age:  31w 2d         43  %
 Est. FW:    4991  gm    3 lb 15 oz      35  %
GESTATIONAL AGE:
 U/S Today:     31w 4d                                        EDD:   04/27/18
 Best:          31w 5d     Det. By:  Early Ultrasound         EDD:   04/26/18
                                     (09/13/17)
ANATOMY:
 Cavum:                 Visualized             Stomach:                Seen
                        previously
 Ventricles:            Normal appearance      Abdominal Wall:         Visualized
                                                                       previously
 Cerebellum:            Visualized             Cord Vessels:           3 vessels,
                        previously                                     visualized previously
 Posterior Fossa:       Visualized             Kidneys:                Normal appearance
 Face:                  Orbits visualized      Bladder:                Seen
 Lips:                  Visualized             Spine:                  Visualized
                        previously                                     previously
 Heart:                 4-Chamber view         Upper Extremities:      Visualized
                        appears normal                                 previously
 RVOT:                  Normal appearance      Lower Extremities:      Visualized
 LVOT:                  Normal appearance

[Series 1: us mfm ob follow-up · 0.25mm/px · 39 acquisitions, 12 frames shown]
[im 2/39]
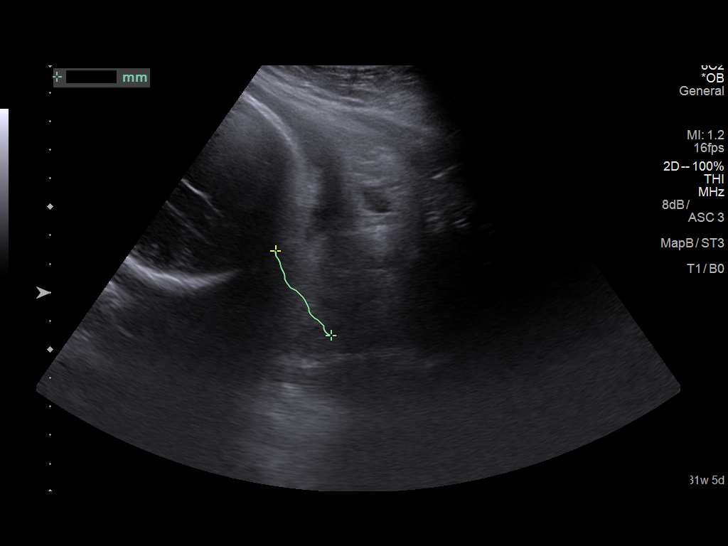
[im 5/39]
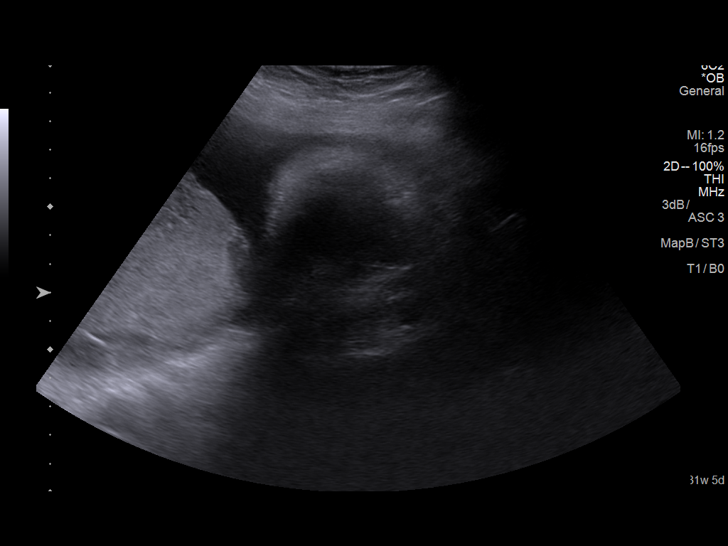
[im 8/39]
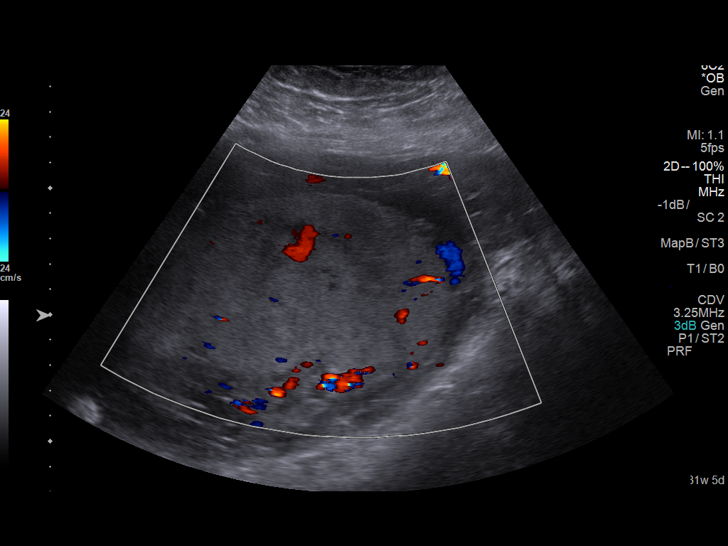
[im 12/39]
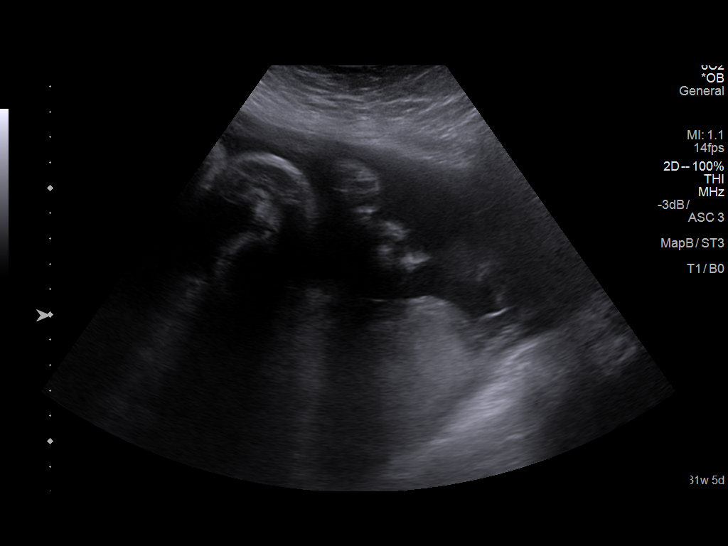
[im 15/39]
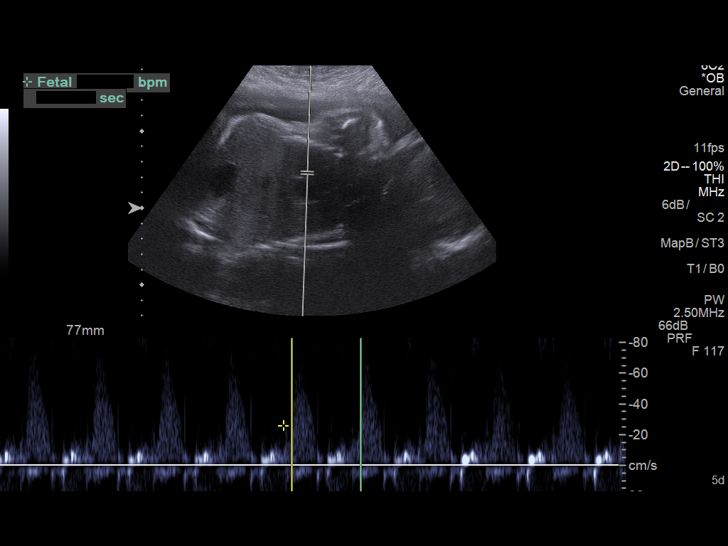
[im 17/39]
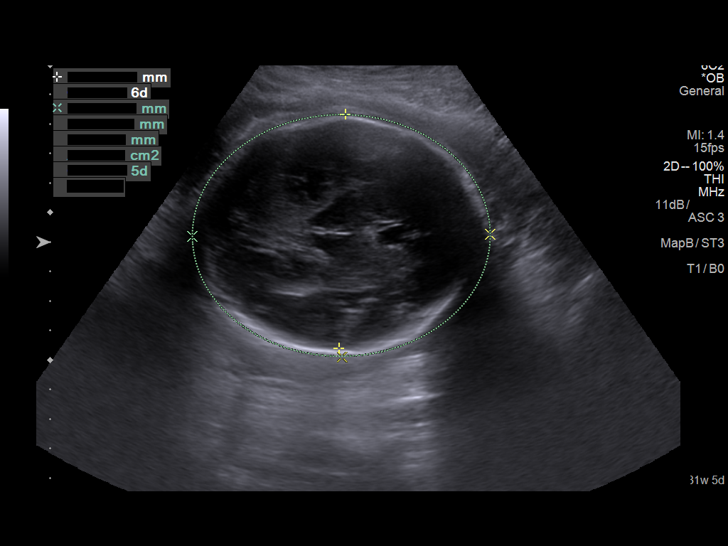
[im 22/39]
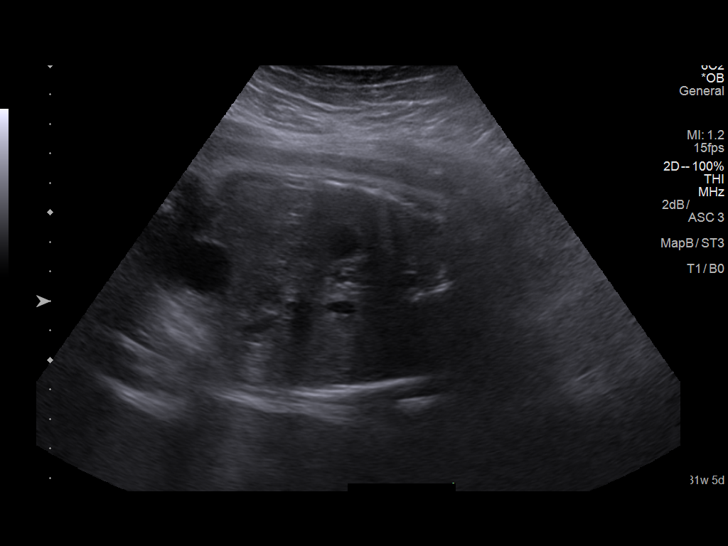
[im 24/39]
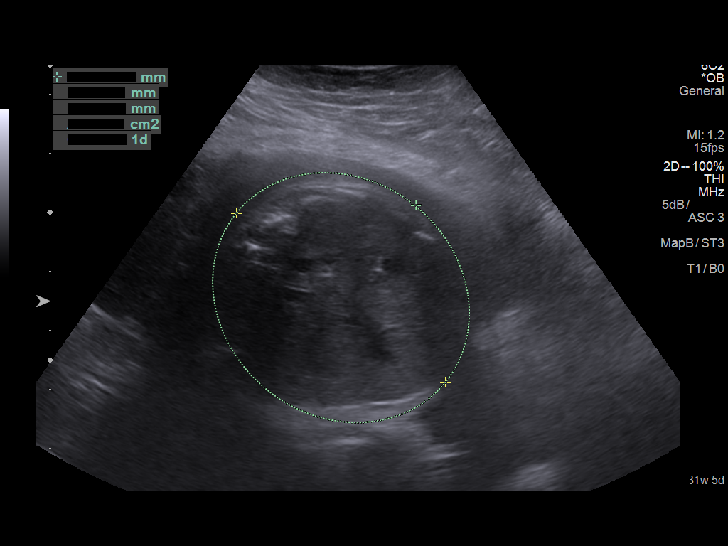
[im 27/39]
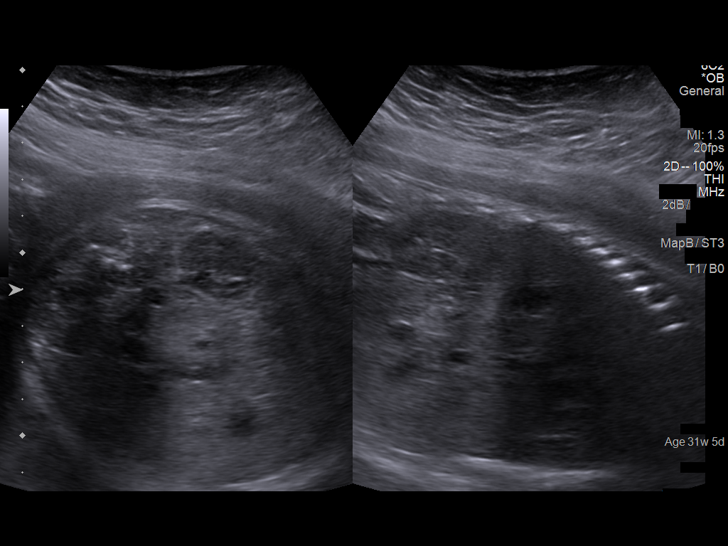
[im 31/39]
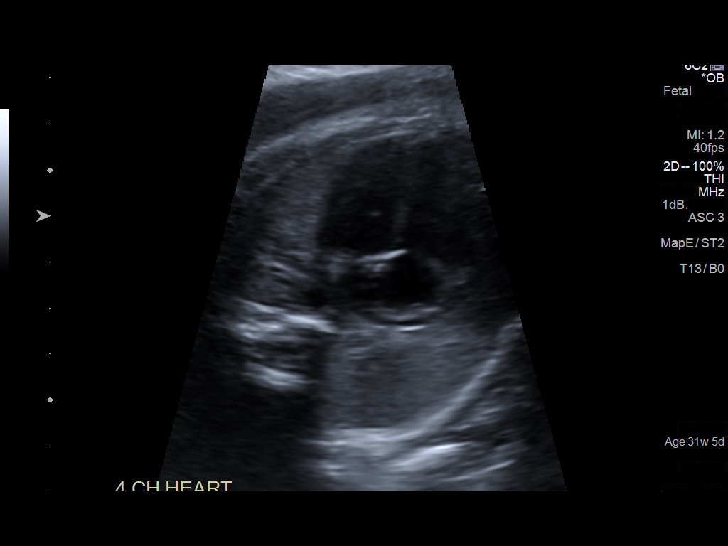
[im 34/39]
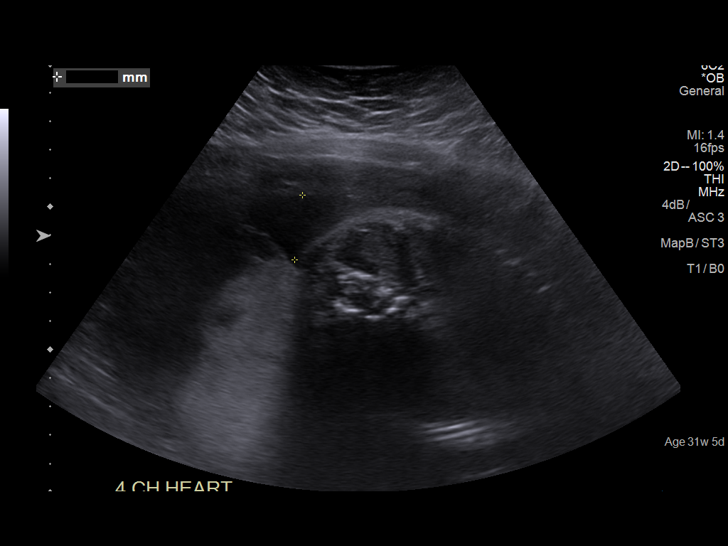
[im 37/39]
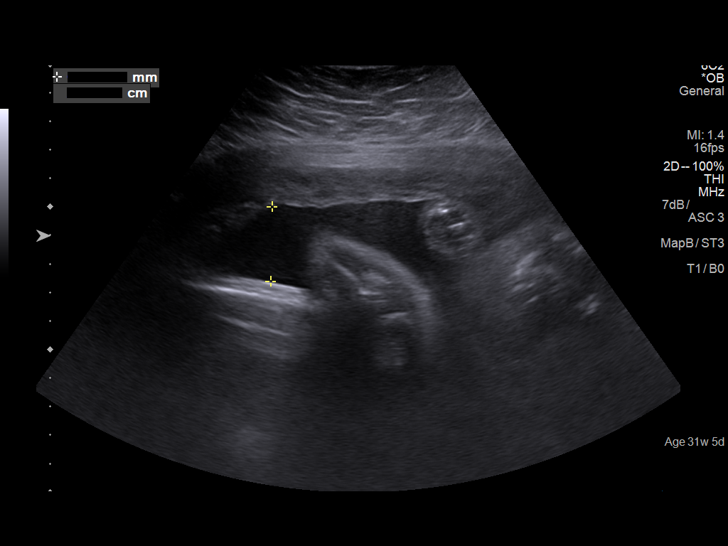

[12 of 28 positions shown; findings below may reference images not displayed]

IMPRESSION: Thank you for referring your patient for a fetal heart rhythm
 check and growth evaluation.

 There is a singleton gestation with normal amniotic fluid
 volume.

 The fetal biometry correlates with established dating. Dating
 based on earliest scan on 09/13/17 at [facility] at 7w 6d
 giving EDC of 04/26/18 and placing her now at 31w 5d
 Adequate interval growth noted.
 The estimated fetal weight is at the 35th    percentile. DIANA.
 Cephalic position, normal fluid. Normal cardiac anatomy on
 standard views.
 Normal heart rhythm noted throughout wiht normal M mode
 tracing .
 Pt advised to avoid caffeine and OTC cold remedies

 Recommend follow-up scan for fetal growth as clinically
 indicated.

 Thank you for allowing us to participate in your patient's care.
 assistance.

## 2020-12-16 ENCOUNTER — Encounter: Payer: Self-pay | Admitting: Otolaryngology

## 2020-12-22 NOTE — Discharge Instructions (Signed)
T & A INSTRUCTION SHEET - [facility]  CREIGHTON VAUGHT, MD  [address] [place], [place] [address] [place].  [phone]  INFORMATION SHEET FOR A TONSILLECTOMY AND ADENDOIDECTOMY  About Your Tonsils and Adenoids  The tonsils and adenoids are normal body tissues that are part of our immune system.  They normally help to protect us against diseases that may enter our mouth and nose. However, sometimes the tonsils and/or adenoids become too large and obstruct our breathing, especially at night.    If either of these things happen it helps to remove the tonsils and adenoids in order to become healthier. The operation to remove the tonsils and adenoids is called a tonsillectomy and adenoidectomy.  The Location of Your Tonsils and Adenoids  The tonsils are located in the back of the throat on both side and sit in a cradle of muscles. The adenoids are located in the roof of the mouth, behind the nose, and closely associated with the opening of the Eustachian tube to the ear.  Surgery on Tonsils and Adenoids  A tonsillectomy and adenoidectomy is a short operation which takes about thirty minutes.  This includes being put to sleep and being awakened. Tonsillectomies and adenoidectomies are performed at [facility] and may require observation period in the recovery room prior to going home. Children are required to remain in recovery for at least 45 minutes.   Following the Operation for a Tonsillectomy  A cautery machine is used to control bleeding. Bleeding from a tonsillectomy and adenoidectomy is minimal and postoperatively the risk of bleeding is approximately four percent, although this rarely life threatening.  After your tonsillectomy and adenoidectomy post-op care at home: 1. Our patients are able to go home the same day. You may be given prescriptions for pain medications, if indicated. 2. It is extremely important to  remember that fluid intake is of utmost importance after a tonsillectomy. The amount that you drink must be maintained in the postoperative period. A good indication of whether a child is getting enough fluid is whether his/her urine output is constant. As long as children are urinating or wetting their diaper every 6 - 8 hours this is usually enough fluid intake.   3. Although rare, this is a risk of some bleeding in the first ten days after surgery. This usually occurs between day five and nine postoperatively. This risk of bleeding is approximately four percent. If you or your child should have any bleeding you should remain calm and notify our office or go directly to the emergency room at [facility] where they will contact us. Our doctors are available seven days a week for notification. We recommend sitting up quietly in a chair, place an ice pack on the front of the neck and spitting out the blood gently until we are able to contact you. Adults should gargle gently with ice water and this may help stop the bleeding. If the bleeding does not stop after a short time, i.e. 10 to 15 minutes, or seems to be increasing again, please contact us or go to the hospital.   4. It is common for the pain to be worse at 5 - 7 days postoperatively. This occurs because the "scab" is peeling off and the mucous membrane (skin of the throat) is growing back where the tonsils were.   5. It is common for a low-grade fever, less than 102, during the first week   after a tonsillectomy and adenoidectomy. It is usually due to not drinking enough liquids, and we suggest your use liquid Tylenol (acetaminophen) or the pain medicine with Tylenol (acetaminophen) prescribed in order to keep your temperature below 102. Please follow the directions on the back of the bottle. 6. Recommendations for post-operative pain in children and adults: a) For Children 12 and younger: Recommendations are for oral Tylenol  (acetaminophen) and oral Motrin (Ibuprofen) along with a prescription dose of Prednisolone which is a steroid to help with pain and swelling. Administer the Tylenol (acetaminophen) and Motrin as stated on bottle for patient's age/weight. Sometimes it may be necessary to alternate the Tylenol (acetaminophen) and Motrin for improved pain control. Motrin does last slightly longer so many patients benefit from being given this prior to bedtime. All children should avoid Aspirin products for 2 weeks following surgery. b) For children over the age of 12: Tylenol (acetaminophen) is the preferred first choice for pain control. Depending on your child's size, sometimes they will be given a combination of Tylenol (acetaminophen) and hydrocodone medication or sometimes it will be recommended they take Motrin (ibuprofen) in addition to the Tylenol (acetaminophen). Narcotics should always be used with caution in children following surgery as they can suppress their breathing and switching to over the counter Tylenol (acetaminophen) and Motrin (ibuprofen) as soon as possible is recommended. All patients should avoid Aspirin products for 2 weeks following surgery. c) Adults: Usually adults will require a narcotic pain medication following a tonsillectomy. This usually has either hydrocodone or oxycodone in it and can usually be taken every 4 to 6 hours as needed for moderate pain. If the medication does not have Tylenol (acetaminophen) in it, you may also supplement Tylenol (acetaminophen) as needed every 4 to 6 hours for breakthrough or mild pain. Adults are also given Viscous Lidocaine to swish and spit every 6 hours to help with topical pain. Adults should avoid Aspirin, Aleve, Motrin, and Ibuprofen products for 2 weeks following surgery as they can increase your risk of bleeding. 7. If you happen to look in the mirror or into your child's mouth you will see white/gray patches on the back of the throat. This is what a scab  looks like in the mouth and is normal after having a tonsillectomy and adenoidectomy. They will disappear once the tonsil areas heal completely. However, it may cause a noticeable odor, and this too will disappear with time.     8. You or your child may experience ear pain after having a tonsillectomy and adenoidectomy.  This is called referred pain and comes from the throat, but it is felt in the ears.  Ear pain is quite common and expected. It will usually go away after ten days. There is usually nothing wrong with the ears, and it is primarily due to the healing area stimulating the nerve to the ear that runs along the side of the throat. Use either the prescribed pain medicine or Tylenol (acetaminophen) as needed.  9. The throat tissues after a tonsillectomy are obviously sensitive. Smoking around children who have had a tonsillectomy significantly increases the risk of bleeding. DO NOT SMOKE!  What to Expect Each Day  First Day at Home 1. Patients will be discharged home the same day.  2. Drink at least four glasses of liquid a day. Clear, cool liquids are recommended. Fruit juices containing citric acid are not recommended because they tend to cause pain. Carbonated beverages are allowed if you pour them from glass   to glass to remove the bubbles as these tend to cause discomfort. Avoid alcoholic beverages.  3. Eat very soft foods such as soups, broth, jello, custard, pudding, ice cream, popsicles, applesauce, mashed potatoes, and in general anything that you can crush between your tongue and the roof of your mouth. Try adding [employer] Mix into your food for extra calories. It is not uncommon to lose 5 to 10 pounds of fluid weight. The weight will be gained back quickly once you're feeling better and drinking more.  4. Sleep with your head elevated on two pillows for about three days to help decrease the swelling.  5. DO NOT SMOKE!  Day Two  1. Rest as much as possible. Use common  sense in your activities.  2. Continue drinking at least four glasses of liquid per day.  3. Follow the soft diet.  4. Use your pain medication as needed.  Day Three  1. Advance your activity as you are able and continue to follow the previous day's suggestions.  Days Four Through Six  1. Advance your diet and begin to eat more solid foods such as chopped hamburger. 2. Advance your activities slowly. Children should be kept mostly around the house.  3. Not uncommonly, there will be more pain at this time. It is temporary, usually lasting a day or two.  Day Seven Through Ten  1. Most individuals by this time are able to return to work or school unless otherwise instructed. Consider sending children back to school for a half day on the first day back.

## 2020-12-24 ENCOUNTER — Ambulatory Visit: Payer: 59 | Admitting: Anesthesiology

## 2020-12-24 ENCOUNTER — Encounter
Admission: RE | Disposition: A | Discharge status: Home / Self Care | Payer: Self-pay | Source: Ambulatory Visit | Attending: Otolaryngology

## 2020-12-24 ENCOUNTER — Encounter: Payer: Self-pay | Admitting: Otolaryngology

## 2020-12-24 ENCOUNTER — Ambulatory Visit
Admission: RE | Admit: 2020-12-24 | Discharge: 2020-12-24 | Disposition: A | Discharge status: Home / Self Care | Payer: 59 | Source: Ambulatory Visit | Attending: Otolaryngology | Admitting: Otolaryngology

## 2020-12-24 ENCOUNTER — Other Ambulatory Visit: Payer: Self-pay

## 2020-12-24 DIAGNOSIS — J351 Hypertrophy of tonsils: Secondary | ICD-10-CM | POA: Diagnosis present

## 2020-12-24 HISTORY — PX: TONSILLECTOMY: SHX5217

## 2020-12-24 SURGERY — TONSILLECTOMY
Anesthesia: General | Laterality: Bilateral

## 2020-12-24 MED ORDER — DOCUSATE SODIUM 100 MG PO CAPS: 100.0000 mg | ORAL_CAPSULE | Freq: Two times a day (BID) | ORAL | 0 refills | Status: DC

## 2020-12-24 MED ORDER — LIDOCAINE HCL (CARDIAC) PF 100 MG/5ML IV SOSY
PREFILLED_SYRINGE | INTRAVENOUS | Status: DC | PRN
  Administered 2020-12-24: 50 mg via INTRAVENOUS

## 2020-12-24 MED ORDER — SCOPOLAMINE 1 MG/3DAYS TD PT72
1.0000 | MEDICATED_PATCH | TRANSDERMAL | Status: DC
  Administered 2020-12-24: 1.5 mg via TRANSDERMAL

## 2020-12-24 MED ORDER — ACETAMINOPHEN 10 MG/ML IV SOLN
1000.0000 mg | Freq: Once | INTRAVENOUS | Status: AC
  Administered 2020-12-24: 1000 mg via INTRAVENOUS

## 2020-12-24 MED ORDER — ONDANSETRON HCL 4 MG/2ML IJ SOLN
INTRAMUSCULAR | Status: DC | PRN
  Administered 2020-12-24: 4 mg via INTRAVENOUS

## 2020-12-24 MED ORDER — BUPIVACAINE HCL (PF) 0.25 % IJ SOLN
INTRAMUSCULAR | Status: DC | PRN
  Administered 2020-12-24: 2 mL

## 2020-12-24 MED ORDER — OXYMETAZOLINE HCL 0.05 % NA SOLN
NASAL | Status: DC | PRN
  Administered 2020-12-24: 1

## 2020-12-24 MED ORDER — PROPOFOL 10 MG/ML IV BOLUS
INTRAVENOUS | Status: DC | PRN
  Administered 2020-12-24: 150 mg via INTRAVENOUS

## 2020-12-24 MED ORDER — ONDANSETRON HCL 4 MG PO TABS: 4.0000 mg | ORAL_TABLET | Freq: Three times a day (TID) | ORAL | 0 refills | Status: DC | PRN

## 2020-12-24 MED ORDER — SUCCINYLCHOLINE CHLORIDE 200 MG/10ML IV SOSY
PREFILLED_SYRINGE | INTRAVENOUS | Status: DC | PRN
  Administered 2020-12-24: 100 mg via INTRAVENOUS

## 2020-12-24 MED ORDER — GLYCOPYRROLATE 0.2 MG/ML IJ SOLN
INTRAMUSCULAR | Status: DC | PRN
  Administered 2020-12-24: .1 mg via INTRAVENOUS

## 2020-12-24 MED ORDER — OXYCODONE HCL 5 MG/5ML PO SOLN: 10.0000 mg | Freq: Four times a day (QID) | ORAL | 0 refills | Status: DC | PRN

## 2020-12-24 MED ORDER — FENTANYL CITRATE (PF) 100 MCG/2ML IJ SOLN
INTRAMUSCULAR | Status: DC | PRN
  Administered 2020-12-24: 100 ug via INTRAVENOUS

## 2020-12-24 MED ORDER — PREDNISONE 10 MG (21) PO TBPK: ORAL_TABLET | ORAL | 0 refills | Status: DC

## 2020-12-24 MED ORDER — FENTANYL CITRATE (PF) 100 MCG/2ML IJ SOLN
25.0000 ug | INTRAMUSCULAR | Status: DC | PRN
  Administered 2020-12-24: 25 ug via INTRAVENOUS

## 2020-12-24 MED ORDER — LACTATED RINGERS IV SOLN: INTRAVENOUS | Status: DC

## 2020-12-24 MED ORDER — OXYCODONE HCL 5 MG PO TABS: 5.0000 mg | ORAL_TABLET | Freq: Once | ORAL | Status: AC | PRN

## 2020-12-24 MED ORDER — MIDAZOLAM HCL 5 MG/5ML IJ SOLN
INTRAMUSCULAR | Status: DC | PRN
  Administered 2020-12-24: 2 mg via INTRAVENOUS

## 2020-12-24 MED ORDER — DEXAMETHASONE SODIUM PHOSPHATE 4 MG/ML IJ SOLN
INTRAMUSCULAR | Status: DC | PRN
  Administered 2020-12-24: 10 mg via INTRAVENOUS

## 2020-12-24 MED ORDER — OXYCODONE HCL 5 MG/5ML PO SOLN
5.0000 mg | Freq: Once | ORAL | Status: AC | PRN
  Administered 2020-12-24: 5 mg via ORAL

## 2020-12-24 MED ORDER — LIDOCAINE VISCOUS HCL 2 % MT SOLN: 10.0000 mL | Freq: Four times a day (QID) | OROMUCOSAL | 0 refills | Status: DC | PRN

## 2020-12-24 SURGICAL SUPPLY — 18 items
BLADE BOVIE TIP EXT 4 (BLADE) ×2 IMPLANT
CANISTER SUCT 1200ML W/VALVE (MISCELLANEOUS) ×2 IMPLANT
CATH ROBINSON RED A/P 10FR (CATHETERS) ×2 IMPLANT
COAG SUCT 10F 3.5MM HAND CTRL (MISCELLANEOUS) ×2 IMPLANT
ELECT REM PT RETURN 9FT ADLT (ELECTROSURGICAL) ×2
ELECTRODE REM PT RTRN 9FT ADLT (ELECTROSURGICAL) ×1 IMPLANT
GLOVE SURG GAMMEX PI TX LF 7.5 (GLOVE) ×2 IMPLANT
HANDLE SUCTION POOLE (INSTRUMENTS) ×1 IMPLANT
KIT TURNOVER KIT A (KITS) ×2 IMPLANT
NS IRRIG 500ML POUR BTL (IV SOLUTION) ×2 IMPLANT
PACK TONSIL AND ADENOID CUSTOM (PACKS) ×2 IMPLANT
PENCIL SMOKE EVACUATOR (MISCELLANEOUS) ×2 IMPLANT
SLEEVE SUCTION 125 (MISCELLANEOUS) ×2 IMPLANT
SOL ANTI-FOG 6CC FOG-OUT (MISCELLANEOUS) ×1 IMPLANT
SOL FOG-OUT ANTI-FOG 6CC (MISCELLANEOUS) ×1
STRAP BODY AND KNEE 60X3 (MISCELLANEOUS) ×2 IMPLANT
SUCTION POOLE HANDLE (INSTRUMENTS) ×2
SYR 5ML LL (SYRINGE) ×2 IMPLANT

## 2020-12-24 NOTE — Op Note (Signed)
..  12/24/2020  8:37 AM    Bethann Berkshire  [id number]   Pre-Op Dx:  Tonsil stones, tonsil hypertrophy  Post-op Dx: same  Proc:Tonsillectomy >age 30  Surg: Roney Mans Lillieanna Tuohy  Anes:  General Endotracheal  EBL:  11ml  Comp:  None  Findings:  3 to 4+ tonsils with tonsillolithiasis and moderate scar to underlying muscle  Procedure: After the patient was identified in holding and the history and physical and consent was reviewed, the patient was taken to the operating room and placed in a supine position.  General endotracheal anesthesia was induced in the normal fashion.  At this time, the patient was rotated 45 degrees and a shoulder roll was placed.  At this time, a McIvor mouthgag was inserted into the patient's oral cavity and suspended from the Mayo stand without injury to teeth, lips, or gums.  Next a red rubber catheter was inserted into the patient left nostril for retraction of the uvula and soft palate superiorly.  Next a curved Alice clamp was attached to the patient's right superior tonsillar pole and retracted medially and inferiorly.  A Bovie electrocautery was used to dissect the patient's right tonsil in a subcapsular plane.  Meticulous hemostasis was achieved with Bovie suction cautery.  At this time, the mouth gag was released from suspension for 1 minute.  Attention now was directed to the patient's left side.  In a similar fashion the curved Alice clamp was attached to the superior pole and this was retracted medially and inferiorly and the tonsil was excised in a subcapsular plane with Bovie electrocautery.  After completion of the second tonsil, meticulous hemostasis was continued.  At this time, the patient's nasal cavity and oral cavity was irrigated with sterile saline.  Two ml of 0.25% Marcaine was injected into the anterior and posterior tonsillar fossa bilaterally.  Following this  The care of patient was returned to anesthesia, awakened, and transferred to  recovery in stable condition.  Dispo:  PACU to home  Plan: Soft diet.  Limit exercise and strenuous activity for 2 weeks.  Fluid hydration  Recheck my office three weeks.   Roney Mans Adilenne Ashworth 8:37 AM 12/24/2020

## 2020-12-24 NOTE — Transfer of Care (Signed)
Immediate Anesthesia Transfer of Care Note  Patient: Lindsay Wagner  Procedure(s) Performed: TONSILLECTOMY (Bilateral)  Patient Location: PACU  Anesthesia Type: General ETT  Level of Consciousness: awake, alert  and patient cooperative  Airway and Oxygen Therapy: Patient Spontanous Breathing and Patient connected to supplemental oxygen  Post-op Assessment: Post-op Vital signs reviewed, Patient's Cardiovascular Status Stable, Respiratory Function Stable, Patent Airway and No signs of Nausea or vomiting  Post-op Vital Signs: Reviewed and stable  Complications: No notable events documented.

## 2020-12-24 NOTE — Anesthesia Preprocedure Evaluation (Signed)
Anesthesia Evaluation  Patient identified by MRN, date of birth, ID band Patient awake    Reviewed: NPO status   Airway Mallampati: II  TM Distance: >3 FB Neck ROM: full    Dental no notable dental hx.    Pulmonary neg pulmonary ROS,    Pulmonary exam normal        Cardiovascular Exercise Tolerance: Good negative cardio ROS Normal cardiovascular exam     Neuro/Psych negative neurological ROS  negative psych ROS   GI/Hepatic Neg liver ROS, GERD  Controlled,  Endo/Other  diabetesMorbid obesity (bmi 46)  Renal/GU negative Renal ROS  negative genitourinary   Musculoskeletal   Abdominal   Peds  Hematology negative hematology ROS (+)   Anesthesia Other Findings   Reproductive/Obstetrics negative OB ROS                             Anesthesia Physical Anesthesia Plan  ASA: 2  Anesthesia Plan: General ETT   Post-op Pain Management:    Induction:   PONV Risk Score and Plan: 3 and Midazolam, Ondansetron and Scopolamine patch - Pre-op  Airway Management Planned:   Additional Equipment:   Intra-op Plan:   Post-operative Plan:   Informed Consent: I have reviewed the patients History and Physical, chart, labs and discussed the procedure including the risks, benefits and alternatives for the proposed anesthesia with the patient or authorized representative who has indicated his/her understanding and acceptance.       Plan Discussed with: CRNA  Anesthesia Plan Comments:         Anesthesia Quick Evaluation

## 2020-12-24 NOTE — Anesthesia Postprocedure Evaluation (Signed)
Anesthesia Post Note  Patient: Lindsay Wagner  Procedure(s) Performed: TONSILLECTOMY (Bilateral)     Patient location during evaluation: PACU Anesthesia Type: General Level of consciousness: awake and alert Pain management: pain level controlled Vital Signs Assessment: post-procedure vital signs reviewed and stable Respiratory status: spontaneous breathing, nonlabored ventilation, respiratory function stable and patient connected to nasal cannula oxygen Cardiovascular status: blood pressure returned to baseline and stable Postop Assessment: no apparent nausea or vomiting Anesthetic complications: no   No notable events documented.  Orrin Brigham

## 2020-12-24 NOTE — Anesthesia Procedure Notes (Signed)
Procedure Name: Intubation Date/Time: 12/24/2020 8:13 AM Performed by: Jimmy Picket, CRNA Pre-anesthesia Checklist: Patient identified, Emergency Drugs available, Suction available, Patient being monitored and Timeout performed Patient Re-evaluated:Patient Re-evaluated prior to induction Oxygen Delivery Method: Circle system utilized Preoxygenation: Pre-oxygenation with 100% oxygen Induction Type: IV induction Ventilation: Mask ventilation without difficulty Laryngoscope Size: Miller and 2 Grade View: Grade I Tube type: Oral Rae Tube size: 7.0 mm Number of attempts: 1 Placement Confirmation: ETT inserted through vocal cords under direct vision, positive ETCO2 and breath sounds checked- equal and bilateral Tube secured with: Tape Dental Injury: Teeth and Oropharynx as per pre-operative assessment

## 2020-12-24 NOTE — H&P (Signed)
..  History and Physical paper copy reviewed and updated date of procedure and will be scanned into system.  Patient seen and examined.  

## 2020-12-24 NOTE — Progress Notes (Signed)
Pregnancy test negative

## 2020-12-25 ENCOUNTER — Encounter: Payer: Self-pay | Admitting: Otolaryngology

## 2020-12-26 LAB — SURGICAL PATHOLOGY

## 2021-07-27 ENCOUNTER — Ambulatory Visit: Payer: Self-pay | Admitting: Nurse Practitioner

## 2021-07-27 ENCOUNTER — Encounter: Payer: Self-pay | Admitting: Nurse Practitioner

## 2021-07-27 DIAGNOSIS — A599 Trichomoniasis, unspecified: Secondary | ICD-10-CM

## 2021-07-27 DIAGNOSIS — Z113 Encounter for screening for infections with a predominantly sexual mode of transmission: Secondary | ICD-10-CM

## 2021-07-27 LAB — WET PREP FOR TRICH, YEAST, CLUE
Trichomonas Exam: POSITIVE — AB
Yeast Exam: NEGATIVE

## 2021-07-27 LAB — HM HIV SCREENING LAB: HM HIV Screening: NEGATIVE

## 2021-07-27 MED ORDER — METRONIDAZOLE 500 MG PO TABS: 500.0000 mg | ORAL_TABLET | Freq: Two times a day (BID) | ORAL | 0 refills | Status: AC

## 2021-07-27 NOTE — Progress Notes (Signed)
[facility] Department ? ?STI clinic/screening visit ?[place] [address] ?[phone] ? ?Subjective:  ?Lindsay Wagner is a 31 y.o. female being seen today for an STI screening visit. The patient reports they do not have symptoms.  Patient reports that they do not desire a pregnancy in the next year.   They reported they are not interested in discussing contraception today.  Patient with a history of tubal ligation.   ? ?Patient's last menstrual period was 07/24/2021 (exact date). ? ? ?Patient has the following medical conditions:   ?Patient Active Problem List  ? Diagnosis Date Noted  ? Status post tubal ligation 03/30/2018  ? Cervical cerclage suture present 12/07/2017  ? BMI 40.0-44.9, adult ([place]) 07/04/2017  ? Morbid obesity ([place]) 07/04/2017  ? History of cervical incompetence 07/04/2017  ? ? ?Chief Complaint  ?Patient presents with  ? SEXUALLY TRANSMITTED DISEASE  ?  Screening  ? ? ?HPI ? ?Patient reports to clinic today for an STD screening.  Patient is asymptomatic.   ? ?Last HIV test per patient/review of record was 2020. ?Patient reports last pap was 05/19/2018.  ? ?Screening for MPX risk: ?Does the patient have an unexplained rash? No ?Is the patient MSM? No ?Does the patient endorse multiple sex partners or anonymous sex partners? No ?Did the patient have close or sexual contact with a person diagnosed with MPX? No ?Has the patient traveled outside the [place] where MPX is endemic? No ?Is there a high clinical suspicion for MPX-- evidenced by one of the following No ? -Unlikely to be chickenpox ? -Lymphadenopathy ? -Rash that present in same phase of evolution on any given body part ?See flowsheet for further details and programmatic requirements.  ? ?Immunization history:  ?Immunization History  ?Administered Date(s) Administered  ? DTP 08/08/1989, 10/10/1989, 12/12/1989, 06/06/1991  ? HPV 9-valent 07/04/2017  ? Hepatitis B, adult 06/06/1991, 07/04/1991, 12/12/1991  ?  Influenza,inj,Quad PF,6+ Mos 12/07/2017  ? MMR 10/11/1990  ? OPV 08/08/1989, 10/10/1989, 06/06/1991  ? Tdap 10/12/2008, 02/15/2018  ?  ? ?The following portions of the patient's history were reviewed and updated as appropriate: allergies, current medications, past medical history, past social history, past surgical history and problem list. ? ?Objective:  ?There were no vitals filed for this visit. ? ?Physical Exam ?Constitutional:   ?   Appearance: Normal appearance.  ?HENT:  ?   Head: Normocephalic.  ?   Right Ear: External ear normal.  ?   Left Ear: External ear normal.  ?   Nose: Nose normal.  ?   Mouth/Throat:  ?   Lips: Pink.  ?   Mouth: Mucous membranes are moist.  ?   Comments: No visible signs of dental caries  ?Pulmonary:  ?   Effort: Pulmonary effort is normal.  ?Abdominal:  ?   General: Abdomen is flat.  ?   Palpations: Abdomen is soft.  ?Genitourinary: ?   Comments: External genitalia/pubic area without nits, lice, edema, erythema, lesions and inguinal adenopathy. ?Vagina with normal mucosa and discharge. ?Cervix without visible lesions. ?Uterus firm, mobile, nt, no masses, no CMT, no adnexal tenderness or fullness. Ph 4.5 ?Musculoskeletal:  ?   Cervical back: Full passive range of motion without pain, normal range of motion and neck supple.  ?Skin: ?   General: Skin is warm and dry.  ?Neurological:  ?   Mental Status: She is alert and oriented to person, place, and time.  ?Psychiatric:     ?  Attention and Perception: Attention normal.     ?   Mood and Affect: Mood normal.     ?   Speech: Speech normal.     ?   Behavior: Behavior is cooperative.  ? ? ? ?Assessment and Plan:  ?Lindsay Wagner is a 31 y.o. female presenting to the [facility] Department for STI screening. ? ?1. Screening examination for venereal disease ?-31 year old female in clinic today for STD screening. ?-Patient accepted all screenings including vaginal CT/GC and bloodwork for HIV/RPR.  ?Patient meets criteria for HepB  screening? No. Ordered? No, low risk ?Patient meets criteria for HepC screening? No. Ordered? No - low risk ? ?Treat wet prep per standing order ?Discussed time line for [facility] results and that patient will be called with positive results and encouraged patient to call if she had not heard in 2 weeks.  ?Counseled to return or seek care for continued or worsening symptoms ?Recommended condom use with all sex ? ?Patient is not currently using  contraception  to prevent pregnancy.  Patient has a history of tubal ligation.  ? ?- HIV [facility] ?- Syphilis Serology, [place] [facility] ?- Chlamydia/Gonorrhea [facility] ?- WET PREP FOR [place], YEAST, CLUE  ? ? ?2. Trichomoniasis ?-Wet mount reviewed,  please treat patient for [facility].  ?- metroNIDAZOLE (FLAGYL) 500 MG tablet; Take 1 tablet (500 mg total) by mouth 2 (two) times daily for 7 days.  Dispense: 14 tablet; Refill: 0  ? ?Return if symptoms worsen or fail to improve. ? ?Gregary Cromer, FNP ?

## 2021-07-27 NOTE — Progress Notes (Signed)
Patient seen for STD testing. Wet prep reviewed, medication dispensed per standing orders. Contact card given.  ?

## 2023-07-09 ENCOUNTER — Other Ambulatory Visit: Payer: Self-pay

## 2023-07-09 ENCOUNTER — Emergency Department

## 2023-07-09 ENCOUNTER — Emergency Department
Admission: EM | Admit: 2023-07-09 | Discharge: 2023-07-09 | Disposition: A | Discharge status: Home / Self Care | Attending: Emergency Medicine | Admitting: Emergency Medicine

## 2023-07-09 DIAGNOSIS — R079 Chest pain, unspecified: Secondary | ICD-10-CM

## 2023-07-09 DIAGNOSIS — R0789 Other chest pain: Secondary | ICD-10-CM | POA: Insufficient documentation

## 2023-07-09 LAB — BASIC METABOLIC PANEL WITH GFR
Anion gap: 8 (ref 5–15)
BUN: 16 mg/dL (ref 6–20)
CO2: 23 mmol/L (ref 22–32)
Calcium: 8.4 mg/dL — ABNORMAL LOW (ref 8.9–10.3)
Chloride: 106 mmol/L (ref 98–111)
Creatinine, Ser: 0.85 mg/dL (ref 0.44–1.00)
GFR, Estimated: >60 mL/min (ref >60)
Glucose, Bld: 88 mg/dL (ref 70–99)
Potassium: 3.8 mmol/L (ref 3.5–5.1)
Sodium: 137 mmol/L (ref 135–145)

## 2023-07-09 LAB — TROPONIN I (HIGH SENSITIVITY): Troponin I (High Sensitivity): 5 ng/L (ref <18)

## 2023-07-09 LAB — LIPASE, BLOOD: Lipase: 38 U/L (ref 11–51)

## 2023-07-09 LAB — CBC
HCT: 37.5 % (ref 36.0–46.0)
Hemoglobin: 12.1 g/dL (ref 12.0–15.0)
MCH: 29 pg (ref 26.0–34.0)
MCHC: 32.3 g/dL (ref 30.0–36.0)
MCV: 89.9 fL (ref 80.0–100.0)
Platelets: 300 10*3/uL (ref 150–400)
RBC: 4.17 MIL/uL (ref 3.87–5.11)
RDW: 13.8 % (ref 11.5–15.5)
WBC: 6.6 10*3/uL (ref 4.0–10.5)
nRBC: 0 % (ref 0.0–0.2)

## 2023-07-09 LAB — HEPATIC FUNCTION PANEL
ALT: 16 U/L (ref 0–44)
AST: 21 U/L (ref 15–41)
Albumin: 3.5 g/dL (ref 3.5–5.0)
Alkaline Phosphatase: 73 U/L (ref 38–126)
Bilirubin, Direct: 0.2 mg/dL (ref 0.0–0.2)
Indirect Bilirubin: 0.5 mg/dL (ref 0.3–0.9)
Total Bilirubin: 0.7 mg/dL (ref 0.0–1.2)
Total Protein: 6.8 g/dL (ref 6.5–8.1)

## 2023-07-09 NOTE — ED Triage Notes (Addendum)
 Patient C/O left-sternal chest pain that began last night. Patient denies any SOB, but states that she feels tightness in the same area upon deep inspiration. Denies any cardiac history. Non-smoker, denies hormonal birth control.

## 2023-07-09 NOTE — ED Provider Notes (Signed)
 [facility] Provider Note    Event Date/Time   First MD Initiated Contact with Patient 07/09/23 [phone]     (approximate)  History   Chief Complaint: Chest Pain  HPI  Lindsay Wagner is a 33 y.o. female with a past medical history of gastric reflux, presents to the emergency department for left-sided chest pain.  According to the patient since last night she has felt a "tightness" in her left chest.  Patient states the tightness is worse with movement specially twisting or moving her left arm.  Denies any nausea shortness of breath or diaphoresis.  No history of cardiac disease.  Denies any recent reflux.  No leg pain or swelling.  Physical Exam   Triage Vital Signs: ED Triage Vitals  Encounter Vitals Group     BP 07/09/23 0612 133/83     Systolic BP Percentile --      Diastolic BP Percentile --      Pulse Rate 07/09/23 0612 (!) 59     Resp 07/09/23 0612 18     Temp 07/09/23 0612 98.2 F (36.8 C)     Temp Source 07/09/23 0612 Oral     SpO2 07/09/23 0612 99 %     Weight 07/09/23 0610 240 lb (108.9 kg)     Height 07/09/23 0610 5\' 4"  (1.626 m)     Head Circumference --      Peak Flow --      Pain Score 07/09/23 0640 3     Pain Loc --      Pain Education --      Exclude from Growth Chart --     Most recent vital signs: Vitals:   07/09/23 0612 07/09/23 0613  BP: 133/83 133/83  Pulse: (!) 59 (!) 58  Resp: 18 18  Temp: 98.2 F (36.8 C) 98.2 F (36.8 C)  SpO2: 99% 100%    General: Awake, no distress.  CV:  Good peripheral perfusion.  Regular rate and rhythm  Resp:  Normal effort.  Equal breath sounds bilaterally.  Abd:  No distention.  Soft, nontender.  No rebound or guarding. Other:  No peripheral edema or tenderness.  ED Results / Procedures / Treatments   EKG  EKG viewed and interpreted by myself shows a normal sinus rhythm at 66 bpm with a narrow QRS, normal axis, normal intervals, no concerning ST changes.  RADIOLOGY  I have reviewed  and interpreted the chest x-ray images.  No consolidation seen on my evaluation. Radiology has read the x-ray as negative.   MEDICATIONS ORDERED IN ED: Medications - No data to display   IMPRESSION / MDM / ASSESSMENT AND PLAN / ED COURSE  I reviewed the triage vital signs and the nursing notes.  Patient's presentation is most consistent with acute presentation with potential threat to life or bodily function.  Patient presents to the emergency department for mild left-sided chest tightness since last night.  Overall the patient appears well, no distress.  Reassuring vital signs, reassuring physical exam.  Patient states the pain is worse if she moves her chest such as twisting turning or any movement of the left arm.  Patient has no leg pain or swelling.  Patient denies any hormonal birth control or supplements.  Patient's workup so far is reassuring, CBC is normal.  Chest x-ray is clear EKG is normal.  Patient is PERC negative.  Suspect likely more chest wall discomfort especially with pain with movement, however as the patient states it  did start last night after eating we will check labs including LFTs and lipase as well as a cardiac panel.  Patient agreeable to plan.  Patient's lab work has resulted showing a normal CBC, normal basic metabolic panel, negative troponin negative LFTs negative lipase.  Given the patient's reassuring workup reassuring labs I believe the patient is safe for discharge home with outpatient follow-up.  Discussed with the patient supportive care at home including Tylenol  or ibuprofen  and rest.    FINAL CLINICAL IMPRESSION(S) / ED DIAGNOSES   Chest pain   Note:  This document was prepared using Dragon voice recognition software and may include unintentional dictation errors.   Ruth Cove, MD 07/09/23 [phone]

## 2023-07-09 NOTE — Discharge Instructions (Addendum)
 As we discussed please use Tylenol  or ibuprofen  if needed for mild chest wall discomfort.  If your chest symptoms worsen please call the number provided for cardiology to arrange a follow-up appointment for further evaluation.  If you develop significant chest pain or any shortness of breath please return to the emergency department immediately for evaluation.

## 2023-07-09 NOTE — ED Notes (Signed)
 Patient transported to X-ray

## 2024-02-03 ENCOUNTER — Emergency Department

## 2024-02-03 ENCOUNTER — Emergency Department
Admission: EM | Admit: 2024-02-03 | Discharge: 2024-02-03 | Disposition: A | Discharge status: Home / Self Care | Attending: Emergency Medicine | Admitting: Emergency Medicine

## 2024-02-03 ENCOUNTER — Other Ambulatory Visit: Payer: Self-pay

## 2024-02-03 DIAGNOSIS — M7661 Achilles tendinitis, right leg: Secondary | ICD-10-CM | POA: Insufficient documentation

## 2024-02-03 DIAGNOSIS — M25571 Pain in right ankle and joints of right foot: Secondary | ICD-10-CM | POA: Diagnosis present

## 2024-02-03 MED ORDER — NAPROXEN 500 MG PO TABS: 500.0000 mg | ORAL_TABLET | Freq: Two times a day (BID) | ORAL | 1 refills | Status: AC

## 2024-02-03 NOTE — ED Notes (Signed)
 See triage note  Presents with pain to right ankle    Pain to posterior ankle  Denies any injury

## 2024-02-03 NOTE — Discharge Instructions (Addendum)
 Your exam and x-ray are normal and reassuring at this time.  You have evidence of Achilles tendinitis.  The symptoms are likely aggravated by your standing walking activities.  Take the prescription meds as directed.  Consider stretching exercises as discussed.  Follow-up with podiatry for ongoing evaluation.  Return to the ED if needed.

## 2024-02-03 NOTE — ED Triage Notes (Signed)
 Patient states pain to back of ankle/heel area; denies injury or trauma but states she walks a lot at work.

## 2024-02-03 NOTE — ED Provider Notes (Signed)
 [facility] Emergency Department Provider Note     Event Date/Time   First MD Initiated Contact with Patient 02/03/24 1323     (approximate)   History   Ankle Pain   HPI  Lindsay Wagner is a 33 y.o. female with a history of obesity and GERD, presents to the ED endorsing pain to the right ankle at the heel.  Patient describes pain is worse in the morning, and aggravated by work activities which include a lot of standing and walking.  Particularly aggravated by walking up an incline.  Denies any frank calf pain, disability, or falls.  She denies any history of chronic ongoing ankle or foot problems.  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  She has not sought care in the prior months for this complaint.  Physical Exam   Triage Vital Signs: ED Triage Vitals  Encounter Vitals Group     BP 02/03/24 1218 130/80     Girls Systolic BP Percentile --      Girls Diastolic BP Percentile --      Boys Systolic BP Percentile --      Boys Diastolic BP Percentile --      Pulse Rate 02/03/24 1218 75     Resp 02/03/24 1218 20     Temp 02/03/24 1218 98.1 F (36.7 C)     Temp Source 02/03/24 1218 Oral     SpO2 02/03/24 1218 100 %     Weight 02/03/24 1217 273 lb (123.8 kg)     Height 02/03/24 1217 5' 4 (1.626 m)     Head Circumference --      Peak Flow --      Pain Score --      Pain Loc --      Pain Education --      Exclude from Growth Chart --     Most recent vital signs: Vitals:   02/03/24 1218  BP: 130/80  Pulse: 75  Resp: 20  Temp: 98.1 F (36.7 C)  SpO2: 100%    General Awake, no distress.  NAD HEENT NCAT. PERRL. EOMI. No rhinorrhea. Mucous membranes are moist.  CV:  Good peripheral perfusion.  No CCE distally RESP:  Normal effort MSK:  Right foot ankle without obvious deformity, dislocation, joint effusion.  Active range of motion noted.  Patient tender palpation to the Achilles tendon insertion at the posterior calcaneal  heel.  Negative Thompson test.  No palpable cords noted.   ED Results / Procedures / Treatments   Labs (all labs ordered are listed, but only abnormal results are displayed) Labs Reviewed - No data to display   EKG   RADIOLOGY  I personally viewed and evaluated these images as part of my medical decision making, as well as reviewing the written report by the radiologist.  ED Provider Interpretation: No acute findings  DG Ankle Complete Right Result Date: 02/03/2024 CLINICAL DATA:  Right ankle pain.  No reported injury. EXAM: RIGHT ANKLE - COMPLETE 3+ VIEW COMPARISON:  None Available. FINDINGS: No acute fracture or malalignment. Ankle mortise is congruent. Joint spaces are maintained. No significant focal soft tissue swelling. IMPRESSION: No acute osseous abnormality. Electronically Signed   By: Harrietta Sherry M.D.   On: 02/03/2024 13:04     PROCEDURES:  Critical Care performed: No  Procedures   MEDICATIONS ORDERED IN ED: Medications - No data to display   IMPRESSION / MDM / ASSESSMENT AND PLAN /  ED COURSE  I reviewed the triage vital signs and the nursing notes.                              Differential diagnosis includes, but is not limited to, ankle sprain, ankle fracture, Achilles tendinitis, plantar fasciitis, DJD  Patient's presentation is most consistent with acute, uncomplicated illness.  Patient's diagnosis is consistent with acute right Achilles tendinitis.  Patient presents to the ED with several weeks of progressive right heel pain at the Achilles attachment.  Exam is overall reassuring without evidence of an acute trauma or sprain.  X-ray images turbid by me, show no acute fracture or dislocation.  Patient given instruction on heel lifts and Achilles tendon stretches.  Patient will be discharged home with prescriptions for naproxen. Patient is to follow up with podiatry as needed or otherwise directed. Patient is given ED precautions to return to the ED for  any worsening or new symptoms.     FINAL CLINICAL IMPRESSION(S) / ED DIAGNOSES   Final diagnoses:  Achilles tendinitis of right lower extremity     Rx / DC Orders   ED Discharge Orders          Ordered    naproxen (NAPROSYN) 500 MG tablet  2 times daily with meals        02/03/24 1436             Note:  This document was prepared using Dragon voice recognition software and may include unintentional dictation errors.    Loyd Candida LULLA Aldona, PA-C 02/04/24 1421    Jacolyn Pae, MD 02/04/24 1510

## 2024-03-20 ENCOUNTER — Telehealth: Admitting: Emergency Medicine

## 2024-03-20 DIAGNOSIS — J069 Acute upper respiratory infection, unspecified: Secondary | ICD-10-CM

## 2024-03-20 NOTE — Patient Instructions (Signed)
" °  Lindsay Wagner, thank you for joining Jon CHRISTELLA Belt, NP for today's virtual visit.  While this provider is not your primary care provider (PCP), if your PCP is located in our provider database this encounter information will be shared with them immediately following your visit.   A [facility] MyChart account gives you access to today's visit and all your visits, tests, and labs performed at [facility]  click here if you don't have a [facility] MyChart account or go to [email or web address]  Consent: (Patient) Lindsay Wagner provided verbal consent for this virtual visit at the beginning of the encounter.  Current Medications: No current outpatient medications on file.   Medications ordered in this encounter:  No orders of the defined types were placed in this encounter.    *If you need refills on other medications prior to your next appointment, please contact your pharmacy*  Follow-Up: Call back or seek an in-person evaluation if the symptoms worsen or if the condition fails to improve as anticipated.  [facility] [phone]  Other Instructions  In addition to alka seltzer cold medicine, I recommend you use saline nasal spray several times a day as needed to make  your congestion drain, use Mucinex (generic guaifenesin ok to use), and try herbal tea (such as chamomile) with honey and lemon in it for your throat.   Cold symptoms can last up to 2 weeks! But you should be improving at least a little in about 5-7 more days.    If you have been instructed to have an in-person evaluation today at a local Urgent Care facility, please use the link below. It will take you to a list of all of our available [facility], including address, phone number and hours of operation. Please do not delay care.  [facility]  If you or a family member do not have a primary care provider, use the link below to schedule a visit and establish  care. When you choose a [facility] primary care physician or advanced practice provider, you gain a long-term partner in health. Find a Primary Care Provider  Learn more about [facility]'s in-office and virtual care options: [facility] - Get Care Now  "

## 2024-03-20 NOTE — Progress Notes (Signed)
 " Virtual Visit Consent   Lindsay Wagner, you are scheduled for a virtual visit with a [facility] provider today. Just as with appointments in the office, your consent must be obtained to participate. Your consent will be active for this visit and any virtual visit you may have with one of our providers in the next 365 days. If you have a MyChart account, a copy of this consent can be sent to you electronically.  As this is a virtual visit, video technology does not allow for your provider to perform a traditional examination. This may limit your provider's ability to fully assess your condition. If your provider identifies any concerns that need to be evaluated in person or the need to arrange testing (such as labs, EKG, etc.), we will make arrangements to do so. Although advances in technology are sophisticated, we cannot ensure that it will always work on either your end or our end. If the connection with a video visit is poor, the visit may have to be switched to a telephone visit. With either a video or telephone visit, we are not always able to ensure that we have a secure connection.  By engaging in this virtual visit, you consent to the provision of healthcare and authorize for your insurance to be billed (if applicable) for the services provided during this visit. Depending on your insurance coverage, you may receive a charge related to this service.  I need to obtain your verbal consent now. Are you willing to proceed with your visit today? Lindsay Wagner has provided verbal consent on 03/20/2024 for a virtual visit (video or telephone). Jon CHRISTELLA Belt, NP  Date: 03/20/2024 3:47 PM   Virtual Visit via Video Note   I, Jon CHRISTELLA Belt, connected with  Lindsay Wagner  ([id number], Mar 26, 1990) on 03/20/2024 at  3:45 PM EST by a video-enabled telemedicine application and verified that I am speaking with the correct person using two identifiers.  Location: Patient: Virtual Visit Location Patient:  Home Provider: Virtual Visit Location Provider: Home Office   I discussed the limitations of evaluation and management by telemedicine and the availability of in person appointments. The patient expressed understanding and agreed to proceed.    History of Present Illness: Lindsay Wagner is a 33 y.o. who identifies as a female who was assigned female at birth, and is being seen today for cold sx. Congested, post nasal drainage, hoarse voice, sweats. Sick for 2 days. No fever, no body aches.   Taking alka seltzer nighttime and daytime.    HPI: HPI  Problems:  Patient Active Problem List   Diagnosis Date Noted   Status post tubal ligation 03/30/2018   Cervical cerclage suture present 12/07/2017   BMI 40.0-44.9, adult (HCC) 07/04/2017   Morbid obesity (HCC) 07/04/2017   History of cervical incompetence 07/04/2017    Allergies: Allergies[1] Medications: Current Medications[2]  Observations/Objective: Patient is well-developed, well-nourished in no acute distress.  Resting comfortably  at home.  Head is normocephalic, atraumatic.  No labored breathing.  Speech is clear and coherent with logical content.  Patient is alert and oriented at baseline.    Assessment and Plan: 1. Upper respiratory tract infection, unspecified type (Primary)  Continue supportive care. Given note for work.   Follow Up Instructions: I discussed the assessment and treatment plan with the patient. The patient was provided an opportunity to ask questions and all were answered. The patient agreed with the plan and demonstrated an understanding of the instructions.  A copy of instructions were sent to the patient via MyChart unless otherwise noted below.   The patient was advised to call back or seek an in-person evaluation if the symptoms worsen or if the condition fails to improve as anticipated.    Jon CHRISTELLA Belt, NP    [1] No Known Allergies [2] No current outpatient medications on file.  "
